# Patient Record
Sex: Female | Born: 1995 | Race: Black or African American | Hispanic: No | Smoking: Never smoker
Health system: Southern US, Community
[De-identification: ages and names within clinical notes are randomized; demographics above are authoritative.]

## PROBLEM LIST (undated history)

## (undated) DIAGNOSIS — Z789 Other specified health status: Secondary | ICD-10-CM

## (undated) HISTORY — PX: WISDOM TOOTH EXTRACTION: SHX21

## (undated) HISTORY — PX: OTHER SURGICAL HISTORY: SHX169

## (undated) HISTORY — DX: Other specified health status: Z78.9

---
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
## 2015-09-18 ENCOUNTER — Encounter: Payer: Self-pay | Admitting: Advanced Practice Midwife

## 2015-09-18 ENCOUNTER — Other Ambulatory Visit: Payer: Self-pay | Admitting: Advanced Practice Midwife

## 2015-11-25 ENCOUNTER — Ambulatory Visit (INDEPENDENT_AMBULATORY_CARE_PROVIDER_SITE_OTHER): Payer: BLUE CROSS/BLUE SHIELD | Admitting: Adult Health

## 2015-11-25 ENCOUNTER — Encounter: Payer: Self-pay | Admitting: Adult Health

## 2015-11-25 VITALS — BP 90/60 | HR 82 | Ht 63.0 in | Wt 113.5 lb

## 2015-11-25 DIAGNOSIS — O219 Vomiting of pregnancy, unspecified: Secondary | ICD-10-CM

## 2015-11-25 DIAGNOSIS — R112 Nausea with vomiting, unspecified: Secondary | ICD-10-CM | POA: Diagnosis not present

## 2015-11-25 DIAGNOSIS — R252 Cramp and spasm: Secondary | ICD-10-CM | POA: Diagnosis not present

## 2015-11-25 DIAGNOSIS — O3680X0 Pregnancy with inconclusive fetal viability, not applicable or unspecified: Secondary | ICD-10-CM

## 2015-11-25 DIAGNOSIS — N926 Irregular menstruation, unspecified: Secondary | ICD-10-CM

## 2015-11-25 DIAGNOSIS — Z3201 Encounter for pregnancy test, result positive: Secondary | ICD-10-CM

## 2015-11-25 DIAGNOSIS — Z3490 Encounter for supervision of normal pregnancy, unspecified, unspecified trimester: Secondary | ICD-10-CM

## 2015-11-25 LAB — POCT URINE PREGNANCY: PREG TEST UR: POSITIVE — AB

## 2015-11-25 MED ORDER — PRENATAL PLUS 27-1 MG PO TABS: 1.0000 | ORAL_TABLET | Freq: Every day | ORAL | 11 refills | Status: DC

## 2015-11-25 MED ORDER — PROMETHAZINE HCL 25 MG PO TABS: 25.0000 mg | ORAL_TABLET | Freq: Four times a day (QID) | ORAL | 1 refills | Status: DC | PRN
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
## 2015-11-25 NOTE — Patient Instructions (Signed)
First Trimester of Pregnancy The first trimester of pregnancy is from week 1 until the end of week 12 (months 1 through 3). A week after a sperm fertilizes an egg, the egg will implant on the wall of the uterus. This embryo will begin to develop into a baby. Genes from you and your partner are forming the baby. The female genes determine whether the baby is a boy or a girl. At 6-8 weeks, the eyes and face are formed, and the heartbeat can be seen on ultrasound. At the end of 12 weeks, all the baby's organs are formed.  Now that you are pregnant, you will want to do everything you can to have a healthy baby. Two of the most important things are to get good prenatal care and to follow your health care provider's instructions. Prenatal care is all the medical care you receive before the baby's birth. This care will help prevent, find, and treat any problems during the pregnancy and childbirth. BODY CHANGES Your body goes through many changes during pregnancy. The changes vary from woman to woman.   You may gain or lose a couple of pounds at first.  You may feel sick to your stomach (nauseous) and throw up (vomit). If the vomiting is uncontrollable, call your health care provider.  You may tire easily.  You may develop headaches that can be relieved by medicines approved by your health care provider.  You may urinate more often. Painful urination may mean you have a bladder infection.  You may develop heartburn as a result of your pregnancy.  You may develop constipation because certain hormones are causing the muscles that push waste through your intestines to slow down.  You may develop hemorrhoids or swollen, bulging veins (varicose veins).  Your breasts may begin to grow larger and become tender. Your nipples may stick out more, and the tissue that surrounds them (areola) may become darker.  Your gums may bleed and may be sensitive to brushing and flossing.  Dark spots or blotches (chloasma,  mask of pregnancy) may develop on your face. This will likely fade after the baby is born.  Your menstrual periods will stop.  You may have a loss of appetite.  You may develop cravings for certain kinds of food.  You may have changes in your emotions from day to day, such as being excited to be pregnant or being concerned that something may go wrong with the pregnancy and baby.  You may have more vivid and strange dreams.  You may have changes in your hair. These can include thickening of your hair, rapid growth, and changes in texture. Some women also have hair loss during or after pregnancy, or hair that feels dry or thin. Your hair will most likely return to normal after your baby is born. WHAT TO EXPECT AT YOUR PRENATAL VISITS During a routine prenatal visit:  You will be weighed to make sure you and the baby are growing normally.  Your blood pressure will be taken.  Your abdomen will be measured to track your baby's growth.  The fetal heartbeat will be listened to starting around week 10 or 12 of your pregnancy.  Test results from any previous visits will be discussed. Your health care provider may ask you:  How you are feeling.  If you are feeling the baby move.  If you have had any abnormal symptoms, such as leaking fluid, bleeding, severe headaches, or abdominal cramping.  If you are using any tobacco products,   including cigarettes, chewing tobacco, and electronic cigarettes.  If you have any questions. Other tests that may be performed during your first trimester include:  Blood tests to find your blood type and to check for the presence of any previous infections. They will also be used to check for low iron levels (anemia) and Rh antibodies. Later in the pregnancy, blood tests for diabetes will be done along with other tests if problems develop.  Urine tests to check for infections, diabetes, or protein in the urine.  An ultrasound to confirm the proper growth  and development of the baby.  An amniocentesis to check for possible genetic problems.  Fetal screens for spina bifida and Down syndrome.  You may need other tests to make sure you and the baby are doing well.  HIV (human immunodeficiency virus) testing. Routine prenatal testing includes screening for HIV, unless you choose not to have this test. HOME CARE INSTRUCTIONS  Medicines  Follow your health care provider's instructions regarding medicine use. Specific medicines may be either safe or unsafe to take during pregnancy.  Take your prenatal vitamins as directed.  If you develop constipation, try taking a stool softener if your health care provider approves. Diet  Eat regular, well-balanced meals. Choose a variety of foods, such as meat or vegetable-based protein, fish, milk and low-fat dairy products, vegetables, fruits, and whole grain breads and cereals. Your health care provider will help you determine the amount of weight gain that is right for you.  Avoid raw meat and uncooked cheese. These carry germs that can cause birth defects in the baby.  Eating four or five small meals rather than three large meals a day may help relieve nausea and vomiting. If you start to feel nauseous, eating a few soda crackers can be helpful. Drinking liquids between meals instead of during meals also seems to help nausea and vomiting.  If you develop constipation, eat more high-fiber foods, such as fresh vegetables or fruit and whole grains. Drink enough fluids to keep your urine clear or pale yellow. Activity and Exercise  Exercise only as directed by your health care provider. Exercising will help you:  Control your weight.  Stay in shape.  Be prepared for labor and delivery.  Experiencing pain or cramping in the lower abdomen or low back is a good sign that you should stop exercising. Check with your health care provider before continuing normal exercises.  Try to avoid standing for long  periods of time. Move your legs often if you must stand in one place for a long time.  Avoid heavy lifting.  Wear low-heeled shoes, and practice good posture.  You may continue to have sex unless your health care provider directs you otherwise. Relief of Pain or Discomfort  Wear a good support bra for breast tenderness.   Take warm sitz baths to soothe any pain or discomfort caused by hemorrhoids. Use hemorrhoid cream if your health care provider approves.   Rest with your legs elevated if you have leg cramps or low back pain.  If you develop varicose veins in your legs, wear support hose. Elevate your feet for 15 minutes, 3-4 times a day. Limit salt in your diet. Prenatal Care  Schedule your prenatal visits by the twelfth week of pregnancy. They are usually scheduled monthly at first, then more often in the last 2 months before delivery.  Write down your questions. Take them to your prenatal visits.  Keep all your prenatal visits as directed by your   health care provider. Safety  Wear your seat belt at all times when driving.  Make a list of emergency phone numbers, including numbers for family, friends, the hospital, and police and fire departments. General Tips  Ask your health care provider for a referral to a local prenatal education class. Begin classes no later than at the beginning of month 6 of your pregnancy.  Ask for help if you have counseling or nutritional needs during pregnancy. Your health care provider can offer advice or refer you to specialists for help with various needs.  Do not use hot tubs, steam rooms, or saunas.  Do not douche or use tampons or scented sanitary pads.  Do not cross your legs for long periods of time.  Avoid cat litter boxes and soil used by cats. These carry germs that can cause birth defects in the baby and possibly loss of the fetus by miscarriage or stillbirth.  Avoid all smoking, herbs, alcohol, and medicines not prescribed by  your health care provider. Chemicals in these affect the formation and growth of the baby.  Do not use any tobacco products, including cigarettes, chewing tobacco, and electronic cigarettes. If you need help quitting, ask your health care provider. You may receive counseling support and other resources to help you quit.  Schedule a dentist appointment. At home, brush your teeth with a soft toothbrush and be gentle when you floss. SEEK MEDICAL CARE IF:   You have dizziness.  You have mild pelvic cramps, pelvic pressure, or nagging pain in the abdominal area.  You have persistent nausea, vomiting, or diarrhea.  You have a bad smelling vaginal discharge.  You have pain with urination.  You notice increased swelling in your face, hands, legs, or ankles. SEEK IMMEDIATE MEDICAL CARE IF:   You have a fever.  You are leaking fluid from your vagina.  You have spotting or bleeding from your vagina.  You have severe abdominal cramping or pain.  You have rapid weight gain or loss.  You vomit blood or material that looks like coffee grounds.  You are exposed to [place] measles and have never had them.  You are exposed to fifth disease or chickenpox.  You develop a severe headache.  You have shortness of breath.  You have any kind of trauma, such as from a fall or a car accident.   This information is not intended to replace advice given to you by your health care provider. Make sure you discuss any questions you have with your health care provider.   Document Released: 01/06/2001 Document Revised: 02/02/2014 Document Reviewed: 11/22/2012 Elsevier Interactive Patient Education [employer]2016 Elsevier Inc. Push fluids Return in 1 week for [place]

## 2015-12-02 ENCOUNTER — Other Ambulatory Visit: Payer: BLUE CROSS/BLUE SHIELD

## 2015-12-05 ENCOUNTER — Ambulatory Visit (INDEPENDENT_AMBULATORY_CARE_PROVIDER_SITE_OTHER): Payer: BLUE CROSS/BLUE SHIELD

## 2015-12-05 DIAGNOSIS — O3680X0 Pregnancy with inconclusive fetal viability, not applicable or unspecified: Secondary | ICD-10-CM

## 2015-12-05 DIAGNOSIS — Z3A08 8 weeks gestation of pregnancy: Secondary | ICD-10-CM

## 2015-12-05 NOTE — Progress Notes (Signed)
US 7+6 wks,single IUP w/ys,pos fht 155 bpm,normal ov's bilat,small amount of simple cul de sac fluid,crl 14.1 mm

## 2015-12-24 ENCOUNTER — Other Ambulatory Visit (HOSPITAL_COMMUNITY)
Admission: RE | Admit: 2015-12-24 | Discharge: 2015-12-24 | Disposition: A | Discharge status: Home / Self Care | Payer: BLUE CROSS/BLUE SHIELD | Source: Ambulatory Visit | Attending: Obstetrics & Gynecology | Admitting: Obstetrics & Gynecology

## 2015-12-24 ENCOUNTER — Encounter: Payer: Self-pay | Admitting: Women's Health

## 2015-12-24 ENCOUNTER — Ambulatory Visit (INDEPENDENT_AMBULATORY_CARE_PROVIDER_SITE_OTHER): Payer: Medicaid Other | Admitting: Women's Health

## 2015-12-24 VITALS — BP 82/64 | HR 74 | Wt 110.0 lb

## 2015-12-24 DIAGNOSIS — Z01419 Encounter for gynecological examination (general) (routine) without abnormal findings: Secondary | ICD-10-CM | POA: Insufficient documentation

## 2015-12-24 DIAGNOSIS — O210 Mild hyperemesis gravidarum: Secondary | ICD-10-CM

## 2015-12-24 DIAGNOSIS — Z124 Encounter for screening for malignant neoplasm of cervix: Secondary | ICD-10-CM

## 2015-12-24 DIAGNOSIS — Z3401 Encounter for supervision of normal first pregnancy, first trimester: Secondary | ICD-10-CM

## 2015-12-24 DIAGNOSIS — Z3A11 11 weeks gestation of pregnancy: Secondary | ICD-10-CM

## 2015-12-24 DIAGNOSIS — Z1389 Encounter for screening for other disorder: Secondary | ICD-10-CM

## 2015-12-24 DIAGNOSIS — Z331 Pregnant state, incidental: Secondary | ICD-10-CM

## 2015-12-24 DIAGNOSIS — Z3400 Encounter for supervision of normal first pregnancy, unspecified trimester: Secondary | ICD-10-CM | POA: Insufficient documentation

## 2015-12-24 DIAGNOSIS — Z113 Encounter for screening for infections with a predominantly sexual mode of transmission: Secondary | ICD-10-CM | POA: Insufficient documentation

## 2015-12-24 DIAGNOSIS — Z3682 Encounter for antenatal screening for nuchal translucency: Secondary | ICD-10-CM

## 2015-12-24 LAB — POCT URINALYSIS DIPSTICK
GLUCOSE UA: NEGATIVE
NITRITE UA: NEGATIVE
Protein, UA: NEGATIVE

## 2015-12-24 MED ORDER — DOXYLAMINE-PYRIDOXINE 10-10 MG PO TBEC: DELAYED_RELEASE_TABLET | ORAL | 6 refills | Status: DC

## 2015-12-24 NOTE — Progress Notes (Signed)
  Subjective:  Maria Henderson is a 20 y.o. G1P0 African American female at 9432w4d by LMP c/w 7wk u/s, being seen today for her first obstetrical visit.  Her obstetrical history is significant for primigravida.  Pregnancy history fully reviewed.  Patient reports n/v, vomits at least once/day- stopped phenergan b/c it was making it worse. Denies vb, cramping, uti s/s, abnormal/malodorous vag d/c, or vulvovaginal itching/irritation.  BP (!) 82/64   Pulse 74   Wt 110 lb (49.9 kg)   LMP 10/14/2015 (Approximate)   BMI 19.49 kg/m   HISTORY: OB History  Gravida Para Term Preterm AB Living  1            SAB TAB Ectopic Multiple Live Births               # Outcome Date GA Lbr Len/2nd Weight Sex Delivery Anes PTL Lv  1 Current              Past Medical History:  Diagnosis Date  . Medical history non-contributory    Past Surgical History:  Procedure Laterality Date  . arm surgery Right    broke arm at 20 years old  . WISDOM TOOTH EXTRACTION     Family History  Problem Relation Age of Onset  . Diabetes Paternal Grandmother   . Breast cancer Mother     Exam   System:     General: Well developed & nourished, no acute distress   Skin: Warm & dry, normal coloration and turgor, no rashes   Neurologic: Alert & oriented, normal mood   Cardiovascular: Regular rate & rhythm   Respiratory: Effort & rate normal, LCTAB, acyanotic   Abdomen: Soft, non tender   Extremities: normal strength, tone   Pelvic Exam:    Perineum: Normal perineum   Vulva: Normal, no lesions   Vagina:  Normal mucosa, normal discharge   Cervix: Normal, bulbous, appears closed   Uterus: Normal size/shape/contour for GA   Thin prep pap smear obtained today w/ reflex high risk HPV cotesting FHR: 152 via doppler   Assessment:   Pregnancy: G1P0 There are no active problems to display for this patient.   4632w4d G1P0 New OB visit N/V of pregnancy  Plan:  Initial labs drawn Continue prenatal  vitamins Problem list reviewed and updated Reviewed n/v relief measures and warning s/s to report Rx [facility]legis, [facility] Reviewed recommended weight gain based on pre-gravid BMI Encouraged well-balanced diet Genetic Screening discussed Integrated Screen: requested Cystic fibrosis screening discussed declined Ultrasound discussed; fetal survey: requested Follow up in 2 weeks for 1st IT/NT and visit [facility] completed  [name], [name] CNM, [facility] 12/24/2015 3:42 PM

## 2015-12-25 LAB — URINALYSIS, ROUTINE W REFLEX MICROSCOPIC
BILIRUBIN UA: NEGATIVE
GLUCOSE, UA: NEGATIVE
Nitrite, UA: NEGATIVE
PROTEIN UA: NEGATIVE
SPEC GRAV UA: 1.012 (ref 1.005–1.030)
Urobilinogen, Ur: 0.2 mg/dL (ref 0.2–1.0)
pH, UA: 5.5 (ref 5.0–7.5)

## 2015-12-25 LAB — RUBELLA SCREEN: Rubella Antibodies, IGG: 13 index (ref >0.99)

## 2015-12-25 LAB — CBC
Hematocrit: 38.5 % (ref 34.0–46.6)
Hemoglobin: 13.2 g/dL (ref 11.1–15.9)
MCH: 30.3 pg (ref 26.6–33.0)
MCHC: 34.3 g/dL (ref 31.5–35.7)
MCV: 88 fL (ref 79–97)
PLATELETS: 334 10*3/uL (ref 150–379)
RBC: 4.36 x10E6/uL (ref 3.77–5.28)
RDW: 13.9 % (ref 12.3–15.4)
WBC: 7.6 10*3/uL (ref 3.4–10.8)

## 2015-12-25 LAB — MICROSCOPIC EXAMINATION: Casts: NONE SEEN /lpf

## 2015-12-25 LAB — HIV ANTIBODY (ROUTINE TESTING W REFLEX): HIV Screen 4th Generation wRfx: NONREACTIVE

## 2015-12-25 LAB — PMP SCREEN PROFILE (10S), URINE
Amphetamine Screen, Ur: NEGATIVE ng/mL
BENZODIAZEPINE SCREEN, URINE: NEGATIVE ng/mL
Barbiturate Screen, Ur: NEGATIVE ng/mL
CREATININE(CRT), U: 64.8 mg/dL (ref 20.0–300.0)
Cannabinoids Ur Ql Scn: NEGATIVE ng/mL
Cocaine(Metab.)Screen, Urine: NEGATIVE ng/mL
METHADONE SCREEN, URINE: NEGATIVE ng/mL
OXYCODONE+OXYMORPHONE UR QL SCN: NEGATIVE ng/mL
Opiate Scrn, Ur: NEGATIVE ng/mL
PCP SCRN UR: NEGATIVE ng/mL
PH UR, DRUG SCRN: 5.1 (ref 4.5–8.9)
PROPOXYPHENE SCREEN: NEGATIVE ng/mL

## 2015-12-25 LAB — SICKLE CELL SCREEN: SICKLE CELL SCREEN: POSITIVE — AB

## 2015-12-25 LAB — HEPATITIS B SURFACE ANTIGEN: HEP B S AG: NEGATIVE

## 2015-12-25 LAB — ANTIBODY SCREEN: Antibody Screen: NEGATIVE

## 2015-12-25 LAB — VARICELLA ZOSTER ANTIBODY, IGG: Varicella zoster IgG: 2187 index (ref >165)

## 2015-12-25 LAB — RPR: RPR Ser Ql: NONREACTIVE

## 2015-12-25 LAB — ABO/RH: Rh Factor: POSITIVE

## 2015-12-26 ENCOUNTER — Encounter: Payer: Self-pay | Admitting: Women's Health

## 2015-12-26 DIAGNOSIS — D573 Sickle-cell trait: Secondary | ICD-10-CM | POA: Insufficient documentation

## 2015-12-26 LAB — CYTOLOGY - PAP
CHLAMYDIA, DNA PROBE: NEGATIVE
DIAGNOSIS: NEGATIVE
NEISSERIA GONORRHEA: NEGATIVE

## 2015-12-26 LAB — URINE CULTURE: Organism ID, Bacteria: NO GROWTH

## 2015-12-26 LAB — SPECIMEN STATUS REPORT

## 2015-12-27 LAB — HEMOGLOBINOPATHY EVALUATION
HEMOGLOBIN A2 QUANTITATION: 4 % — AB (ref 0.7–3.1)
HGB A: 54.2 % — AB (ref 94.0–98.0)
HGB C: 0 %
HGB S: 41.8 % — AB
Hemoglobin F Quantitation: 0 % (ref 0.0–2.0)

## 2015-12-27 LAB — SPECIMEN STATUS REPORT

## 2016-01-07 ENCOUNTER — Encounter: Payer: Self-pay | Admitting: Obstetrics & Gynecology

## 2016-01-07 ENCOUNTER — Ambulatory Visit (INDEPENDENT_AMBULATORY_CARE_PROVIDER_SITE_OTHER): Payer: BLUE CROSS/BLUE SHIELD

## 2016-01-07 ENCOUNTER — Ambulatory Visit (INDEPENDENT_AMBULATORY_CARE_PROVIDER_SITE_OTHER): Payer: BLUE CROSS/BLUE SHIELD | Admitting: Obstetrics & Gynecology

## 2016-01-07 ENCOUNTER — Other Ambulatory Visit: Payer: BLUE CROSS/BLUE SHIELD

## 2016-01-07 VITALS — BP 120/80 | HR 84 | Wt 110.0 lb

## 2016-01-07 DIAGNOSIS — Z331 Pregnant state, incidental: Secondary | ICD-10-CM

## 2016-01-07 DIAGNOSIS — Z1389 Encounter for screening for other disorder: Secondary | ICD-10-CM

## 2016-01-07 DIAGNOSIS — Z3A13 13 weeks gestation of pregnancy: Secondary | ICD-10-CM

## 2016-01-07 DIAGNOSIS — Z3682 Encounter for antenatal screening for nuchal translucency: Secondary | ICD-10-CM | POA: Diagnosis not present

## 2016-01-07 DIAGNOSIS — Z3401 Encounter for supervision of normal first pregnancy, first trimester: Secondary | ICD-10-CM

## 2016-01-07 LAB — POCT URINALYSIS DIPSTICK
GLUCOSE UA: NEGATIVE
Ketones, UA: NEGATIVE
Leukocytes, UA: NEGATIVE
Nitrite, UA: NEGATIVE
Protein, UA: NEGATIVE
RBC UA: NEGATIVE

## 2016-01-07 NOTE — Progress Notes (Signed)
US 12+4 wks,measurements c/w dates,pos fht 155 bpm,normal ov's bilat,NB present,NT 1.4 mm,post pl gr 0

## 2016-01-07 NOTE — Progress Notes (Signed)
G1P0 534w4d Estimated Date of Delivery: 07/17/16  Blood pressure 120/80, pulse 84, weight 110 lb (49.9 kg), last menstrual period 10/11/2015.   BP weight and urine results all reviewed and noted.  Please refer to the obstetrical flow sheet for the fundal height and fetal heart rate documentation:  Patient reports good fetal movement, denies any bleeding and no rupture of membranes symptoms or regular contractions. Patient is without complaints. All questions were answered.  Orders Placed This Encounter  Procedures  . Maternal Screen, Integrated #1  . POCT urinalysis dipstick    Plan:  Continued routine obstetrical care, NT normal  1st IT today, 2nd IT in 4 weeks

## 2016-01-10 LAB — MATERNAL SCREEN, INTEGRATED #1
CROWN RUMP LENGTH MAT SCREEN: 62.1 mm
GEST. AGE ON COLLECTION DATE: 12.6 wk
Maternal Age at EDD: 22.4 years
NUMBER OF FETUSES: 1
Nuchal Translucency (NT): 1.4 mm
PAPP-A VALUE: 1188.3 ng/mL
WEIGHT: 110 [lb_av]

## 2016-01-27 NOTE — L&D Delivery Note (Signed)
Delivery Note At 10:28 AM a viable female was delivered via Vaginal, Spontaneous Delivery (Presentation: OA).  APGAR: 8, 9; weight 7 lb 2.6 oz (3250 g).   Placenta status: delivered intact via Tomasa BlaseSchultz. Cord: with the following complications: none. Cord pH: n/a  Anesthesia: epidural   Episiotomy: None Lacerations: 1st degree perineal; bilateral Labial Suture Repair: 3.0 vicryl rapide Est. Blood Loss (mL): 100  Mom to postpartum.  Baby to Couplet care / Skin to Skin.  Donette LarryMelanie Ledonna Dormer, CNM 06/27/2016, 11:42 AM

## 2016-02-04 ENCOUNTER — Ambulatory Visit (INDEPENDENT_AMBULATORY_CARE_PROVIDER_SITE_OTHER): Payer: BLUE CROSS/BLUE SHIELD | Admitting: Women's Health

## 2016-02-04 ENCOUNTER — Encounter: Payer: Self-pay | Admitting: Women's Health

## 2016-02-04 VITALS — BP 119/64 | HR 105 | Wt 111.0 lb

## 2016-02-04 DIAGNOSIS — Z3682 Encounter for antenatal screening for nuchal translucency: Secondary | ICD-10-CM

## 2016-02-04 DIAGNOSIS — D573 Sickle-cell trait: Secondary | ICD-10-CM

## 2016-02-04 DIAGNOSIS — Z363 Encounter for antenatal screening for malformations: Secondary | ICD-10-CM

## 2016-02-04 DIAGNOSIS — Z3A17 17 weeks gestation of pregnancy: Secondary | ICD-10-CM

## 2016-02-04 DIAGNOSIS — Z331 Pregnant state, incidental: Secondary | ICD-10-CM

## 2016-02-04 DIAGNOSIS — Z1389 Encounter for screening for other disorder: Secondary | ICD-10-CM

## 2016-02-04 DIAGNOSIS — Z3402 Encounter for supervision of normal first pregnancy, second trimester: Secondary | ICD-10-CM

## 2016-02-04 LAB — POCT URINALYSIS DIPSTICK
Glucose, UA: NEGATIVE
Ketones, UA: NEGATIVE
NITRITE UA: NEGATIVE
Protein, UA: NEGATIVE
RBC UA: NEGATIVE

## 2016-02-04 NOTE — Patient Instructions (Signed)
Second Trimester of Pregnancy The second trimester is from week 13 through week 28 (months 4 through 6). The second trimester is often a time when you feel your best. Your body has also adjusted to being pregnant, and you begin to feel better physically. Usually, morning sickness has lessened or quit completely, you may have more energy, and you may have an increase in appetite. The second trimester is also a time when the fetus is growing rapidly. At the end of the sixth month, the fetus is about 9 inches long and weighs about 1 pounds. You will likely begin to feel the baby move (quickening) between 18 and 20 weeks of the pregnancy. Body changes during your second trimester Your body continues to go through many changes during your second trimester. The changes vary from woman to woman.  Your weight will continue to increase. You will notice your lower abdomen bulging out.  You may begin to get stretch marks on your hips, abdomen, and breasts.  You may develop headaches that can be relieved by medicines. The medicines should be approved by your health care provider.  You may urinate more often because the fetus is pressing on your bladder.  You may develop or continue to have heartburn as a result of your pregnancy.  You may develop constipation because certain hormones are causing the muscles that push waste through your intestines to slow down.  You may develop hemorrhoids or swollen, bulging veins (varicose veins).  You may have back pain. This is caused by:  Weight gain.  Pregnancy hormones that are relaxing the joints in your pelvis.  A shift in weight and the muscles that support your balance.  Your breasts will continue to grow and they will continue to become tender.  Your gums may bleed and may be sensitive to brushing and flossing.  Dark spots or blotches (chloasma, mask of pregnancy) may develop on your face. This will likely fade after the baby is born.  A dark line  from your belly button to the pubic area (linea nigra) may appear. This will likely fade after the baby is born.  You may have changes in your hair. These can include thickening of your hair, rapid growth, and changes in texture. Some women also have hair loss during or after pregnancy, or hair that feels dry or thin. Your hair will most likely return to normal after your baby is born. What to expect at prenatal visits During a routine prenatal visit:  You will be weighed to make sure you and the fetus are growing normally.  Your blood pressure will be taken.  Your abdomen will be measured to track your baby's growth.  The fetal heartbeat will be listened to.  Any test results from the previous visit will be discussed. Your health care provider may ask you:  How you are feeling.  If you are feeling the baby move.  If you have had any abnormal symptoms, such as leaking fluid, bleeding, severe headaches, or abdominal cramping.  If you are using any tobacco products, including cigarettes, chewing tobacco, and electronic cigarettes.  If you have any questions. Other tests that may be performed during your second trimester include:  Blood tests that check for:  Low iron levels (anemia).  Gestational diabetes (between 24 and 28 weeks).  Rh antibodies. This is to check for a protein on red blood cells (Rh factor).  Urine tests to check for infections, diabetes, or protein in the urine.  An ultrasound to   confirm the proper growth and development of the baby.  An amniocentesis to check for possible genetic problems.  Fetal screens for spina bifida and Down syndrome.  HIV (human immunodeficiency virus) testing. Routine prenatal testing includes screening for HIV, unless you choose not to have this test. Follow these instructions at home: Eating and drinking  Continue to eat regular, healthy meals.  Avoid raw meat, uncooked cheese, cat litter boxes, and soil used by cats. These  carry germs that can cause birth defects in the baby.  Take your prenatal vitamins.  Take 1500-2000 mg of calcium daily starting at the 20th week of pregnancy until you deliver your baby.  If you develop constipation:  Take over-the-counter or prescription medicines.  Drink enough fluid to keep your urine clear or pale yellow.  Eat foods that are high in fiber, such as fresh fruits and vegetables, whole grains, and beans.  Limit foods that are high in fat and processed sugars, such as fried and sweet foods. Activity  Exercise only as directed by your health care provider. Experiencing uterine cramps is a good sign to stop exercising.  Avoid heavy lifting, wear low heel shoes, and practice good posture.  Wear your seat belt at all times when driving.  Rest with your legs elevated if you have leg cramps or low back pain.  Wear a good support bra for breast tenderness.  Do not use hot tubs, steam rooms, or saunas. Lifestyle  Avoid all smoking, herbs, alcohol, and unprescribed drugs. These chemicals affect the formation and growth of the baby.  Do not use any products that contain nicotine or tobacco, such as cigarettes and e-cigarettes. If you need help quitting, ask your health care provider.  A sexual relationship may be continued unless your health care provider directs you otherwise. General instructions  Follow your health care provider's instructions regarding medicine use. There are medicines that are either safe or unsafe to take during pregnancy.  Take warm sitz baths to soothe any pain or discomfort caused by hemorrhoids. Use hemorrhoid cream if your health care provider approves.  If you develop varicose veins, wear support hose. Elevate your feet for 15 minutes, 3-4 times a day. Limit salt in your diet.  Visit your dentist if you have not gone yet during your pregnancy. Use a soft toothbrush to brush your teeth and be gentle when you floss.  Keep all follow-up  prenatal visits as told by your health care provider. This is important. Contact a health care provider if:  You have dizziness.  You have mild pelvic cramps, pelvic pressure, or nagging pain in the abdominal area.  You have persistent nausea, vomiting, or diarrhea.  You have a bad smelling vaginal discharge.  You have pain with urination. Get help right away if:  You have a fever.  You are leaking fluid from your vagina.  You have spotting or bleeding from your vagina.  You have severe abdominal cramping or pain.  You have rapid weight gain or weight loss.  You have shortness of breath with chest pain.  You notice sudden or extreme swelling of your face, hands, ankles, feet, or legs.  You have not felt your baby move in over an hour.  You have severe headaches that do not go away with medicine.  You have vision changes. Summary  The second trimester is from week 13 through week 28 (months 4 through 6). It is also a time when the fetus is growing rapidly.  Your body goes   through many changes during pregnancy. The changes vary from woman to woman.  Avoid all smoking, herbs, alcohol, and unprescribed drugs. These chemicals affect the formation and growth your baby.  Do not use any tobacco products, such as cigarettes, chewing tobacco, and e-cigarettes. If you need help quitting, ask your health care provider.  Contact your health care provider if you have any questions. Keep all prenatal visits as told by your health care provider. This is important. This information is not intended to replace advice given to you by your health care provider. Make sure you discuss any questions you have with your health care provider. Document Released: 01/06/2001 Document Revised: 06/20/2015 Document Reviewed: 03/15/2012 Elsevier Interactive Patient Education  2017 Elsevier Inc.  

## 2016-02-04 NOTE — Progress Notes (Signed)
Low-risk OB appointment G1P0 5452w4d Estimated Date of Delivery: 07/17/16 BP 119/64   Pulse (!) 105   Wt 111 lb (50.3 kg)   LMP 10/11/2015   BMI 19.66 kg/m   BP, weight, and urine reviewed.  Refer to obstetrical flow sheet for FH & FHR.  Feeling some fm. Denies cramping, lof, vb, or uti s/s. No complaints. Reviewed warning s/s to report. Discussed [place] trait +, fob unsure if he's ever been tested- to talk to parents, can do next visit if not.  Plan:  Continue routine obstetrical care  F/U in 3wks for OB appointment and anatomy u/s 2nd IT today

## 2016-02-07 LAB — URINE CULTURE

## 2016-02-08 LAB — MATERNAL SCREEN, INTEGRATED #2
AFP MoM: 0.85
Alpha-Fetoprotein: 37.3 ng/mL
Crown Rump Length: 62.1 mm
DIA MOM: 1.3
DIA VALUE: 277.9 pg/mL
ESTRIOL UNCONJUGATED: 0.67 ng/mL
GESTATIONAL AGE: 16.6 wk
Gest. Age on Collection Date: 12.6 weeks
HCG MOM: 2.44
Maternal Age at EDD: 22.4 years
NUCHAL TRANSLUCENCY (NT): 1.4 mm
NUCHAL TRANSLUCENCY MOM: 0.89
Number of Fetuses: 1
PAPP-A MOM: 0.85
PAPP-A Value: 1188.3 ng/mL
Test Results:: NEGATIVE
Weight: 110 [lb_av]
Weight: 110 [lb_av]
hCG Value: 87 IU/mL
uE3 MoM: 0.68

## 2016-02-25 ENCOUNTER — Encounter: Payer: Self-pay | Admitting: Advanced Practice Midwife

## 2016-02-25 ENCOUNTER — Ambulatory Visit (INDEPENDENT_AMBULATORY_CARE_PROVIDER_SITE_OTHER): Payer: Medicaid Other | Admitting: Advanced Practice Midwife

## 2016-02-25 ENCOUNTER — Ambulatory Visit (INDEPENDENT_AMBULATORY_CARE_PROVIDER_SITE_OTHER): Payer: Medicaid Other

## 2016-02-25 VITALS — BP 98/50 | HR 76 | Wt 113.0 lb

## 2016-02-25 DIAGNOSIS — Z363 Encounter for antenatal screening for malformations: Secondary | ICD-10-CM | POA: Diagnosis not present

## 2016-02-25 DIAGNOSIS — O321XX1 Maternal care for breech presentation, fetus 1: Secondary | ICD-10-CM | POA: Diagnosis not present

## 2016-02-25 DIAGNOSIS — O3492 Maternal care for abnormality of pelvic organ, unspecified, second trimester: Secondary | ICD-10-CM

## 2016-02-25 DIAGNOSIS — Z3A20 20 weeks gestation of pregnancy: Secondary | ICD-10-CM

## 2016-02-25 DIAGNOSIS — Z3402 Encounter for supervision of normal first pregnancy, second trimester: Secondary | ICD-10-CM

## 2016-02-25 DIAGNOSIS — Z1389 Encounter for screening for other disorder: Secondary | ICD-10-CM

## 2016-02-25 DIAGNOSIS — Z331 Pregnant state, incidental: Secondary | ICD-10-CM

## 2016-02-25 LAB — POCT URINALYSIS DIPSTICK
Blood, UA: NEGATIVE
GLUCOSE UA: NEGATIVE
Ketones, UA: NEGATIVE
LEUKOCYTES UA: NEGATIVE
Protein, UA: NEGATIVE

## 2016-02-25 NOTE — Progress Notes (Signed)
US 19+4 wks,breech,cx 3.1 cm,normal right ovary,left ovary not visualized,post pl gr 0,bilat choroid plexus cyst, right (#1) 2.7 x 4.6 x 2.7 mm, (#2)4.5 x 2.5 x 3.9 mm,left 3.1 x 3 x 4 mm,svp of fluid 4.2 cm,efw 316 g,anatomy complete

## 2016-02-25 NOTE — Progress Notes (Signed)
G1P0 5443w4d Estimated Date of Delivery: 07/17/16  Last menstrual period 10/11/2015.   BP weight and urine results all reviewed and noted.  Please refer to the obstetrical flow sheet for the fundal height and fetal heart rate documentation:  US 19+4 wks,breech,cx 3.1 cm,normal right ovary,left ovary not visualized,post pl gr 0,bilat choroid plexus cyst, right (#1) 2.7 x 4.6 x 2.7 mm, (#2)4.5 x 2.5 x 3.9 mm,left 3.1 x 3 x 4 mm,svp of fluid 4.2 cm,efw 316 g,anatomy complete  Patient reports good fetal movement, denies any bleeding and no rupture of membranes symptoms or regular contractions. Patient is without complaints. All questions were answered.  Orders Placed This Encounter  Procedures  . POCT urinalysis dipstick    Plan:  Continued routine obstetrical care,   Return in about 4 weeks (around 03/24/2016) for LROB.

## 2016-02-25 NOTE — Patient Instructions (Signed)
Second Trimester of Pregnancy The second trimester is from week 13 through week 28, month 4 through 6. This is often the time in pregnancy that you feel your best. Often times, morning sickness has lessened or quit. You may have more energy, and you may get hungry more often. Your unborn baby (fetus) is growing rapidly. At the end of the sixth month, he or she is about 9 inches long and weighs about 1 pounds. You will likely feel the baby move (quickening) between 18 and 20 weeks of pregnancy. Follow these instructions at home:  Avoid all smoking, herbs, and alcohol. Avoid drugs not approved by your doctor.  Do not use any tobacco products, including cigarettes, chewing tobacco, and electronic cigarettes. If you need help quitting, ask your doctor. You may get counseling or other support to help you quit.  Only take medicine as told by your doctor. Some medicines are safe and some are not during pregnancy.  Exercise only as told by your doctor. Stop exercising if you start having cramps.  Eat regular, healthy meals.  Wear a good support bra if your breasts are tender.  Do not use hot tubs, steam rooms, or saunas.  Wear your seat belt when driving.  Avoid raw meat, uncooked cheese, and liter boxes and soil used by cats.  Take your prenatal vitamins.  Take 1500-2000 milligrams of calcium daily starting at the 20th week of pregnancy until you deliver your baby.  Try taking medicine that helps you poop (stool softener) as needed, and if your doctor approves. Eat more fiber by eating fresh fruit, vegetables, and whole grains. Drink enough fluids to keep your pee (urine) clear or pale yellow.  Take warm water baths (sitz baths) to soothe pain or discomfort caused by hemorrhoids. Use hemorrhoid cream if your doctor approves.  If you have puffy, bulging veins (varicose veins), wear support hose. Raise (elevate) your feet for 15 minutes, 3-4 times a day. Limit salt in your diet.  Avoid heavy  lifting, wear low heals, and sit up straight.  Rest with your legs raised if you have leg cramps or low back pain.  Visit your dentist if you have not gone during your pregnancy. Use a soft toothbrush to brush your teeth. Be gentle when you floss.  You can have sex (intercourse) unless your doctor tells you not to.  Go to your doctor visits. Get help if:  You feel dizzy.  You have mild cramps or pressure in your lower belly (abdomen).  You have a nagging pain in your belly area.  You continue to feel sick to your stomach (nauseous), throw up (vomit), or have watery poop (diarrhea).  You have bad smelling fluid coming from your vagina.  You have pain with peeing (urination). Get help right away if:  You have a fever.  You are leaking fluid from your vagina.  You have spotting or bleeding from your vagina.  You have severe belly cramping or pain.  You lose or gain weight rapidly.  You have trouble catching your breath and have chest pain.  You notice sudden or extreme puffiness (swelling) of your face, hands, ankles, feet, or legs.  You have not felt the baby move in over an hour.  You have severe headaches that do not go away with medicine.  You have vision changes. This information is not intended to replace advice given to you by your health care provider. Make sure you discuss any questions you have with your health care   provider. Document Released: 04/08/2009 Document Revised: 06/20/2015 Document Reviewed: 03/15/2012 Elsevier Interactive Patient Education  2017 [employer]  

## 2016-03-18 ENCOUNTER — Encounter: Payer: Self-pay | Admitting: Obstetrics & Gynecology

## 2016-03-24 ENCOUNTER — Ambulatory Visit (INDEPENDENT_AMBULATORY_CARE_PROVIDER_SITE_OTHER): Payer: Medicaid Other | Admitting: Advanced Practice Midwife

## 2016-03-24 ENCOUNTER — Encounter: Payer: Self-pay | Admitting: Advanced Practice Midwife

## 2016-03-24 VITALS — BP 90/60 | HR 85 | Wt 119.0 lb

## 2016-03-24 DIAGNOSIS — Z3A24 24 weeks gestation of pregnancy: Secondary | ICD-10-CM

## 2016-03-24 DIAGNOSIS — O3503X0 Maternal care for (suspected) central nervous system malformation or damage in fetus, choroid plexus cysts, not applicable or unspecified: Secondary | ICD-10-CM

## 2016-03-24 DIAGNOSIS — Z331 Pregnant state, incidental: Secondary | ICD-10-CM

## 2016-03-24 DIAGNOSIS — O350XX0 Maternal care for (suspected) central nervous system malformation in fetus, not applicable or unspecified: Secondary | ICD-10-CM

## 2016-03-24 DIAGNOSIS — Z3402 Encounter for supervision of normal first pregnancy, second trimester: Secondary | ICD-10-CM

## 2016-03-24 DIAGNOSIS — Z1389 Encounter for screening for other disorder: Secondary | ICD-10-CM

## 2016-03-24 LAB — POCT URINALYSIS DIPSTICK
Blood, UA: NEGATIVE
Glucose, UA: NEGATIVE
KETONES UA: NEGATIVE
Nitrite, UA: NEGATIVE
PROTEIN UA: NEGATIVE

## 2016-03-24 NOTE — Progress Notes (Signed)
G1P0 150w4d Estimated Date of Delivery: 07/17/16  Blood pressure 90/60, pulse 85, weight 119 lb (54 kg), last menstrual period 10/11/2015.   BP weight and urine results all reviewed and noted.  Please refer to the obstetrical flow sheet for the fundal height and fetal heart rate documentation:  Patient reports good fetal movement, denies any bleeding and no rupture of membranes symptoms or regular contractions. Patient is without complaints. All questions were answered.  Orders Placed This Encounter  Procedures  . US OB Follow Up  . POCT urinalysis dipstick    Plan:  Continued routine obstetrical care,   Return in about 4 weeks (around 04/21/2016) for PN2/LROB, US:EFW.  Recheck CP cysts

## 2016-03-24 NOTE — Patient Instructions (Addendum)
1. Before your test, do not eat or drink anything for 8-10 hours prior to your  appointment (a small amount of water is allowed and you may take any medicines you normally take). Be sure to drink lots of water the day before. 2. When you arrive, your blood will be drawn for a 'fasting' blood sugar level.  Then you will be given a sweetened carbonated beverage to drink. You should  complete drinking this beverage within five minutes. After finishing the  beverage, you will have your blood drawn exactly 1 and 2 hours later. Having  your blood drawn on time is an important part of this test. A total of three blood  samples will be done. 3. The test takes approximately 2  hours. During the test, do not have anything to  eat or drink. Do not smoke, chew gum (not even sugarless gum) or use breath mints.  4. During the test you should remain close by and seated as much as possible and  avoid walking around. You may want to bring a book or something else to  occupy your time.  5. After your test, you may eat and drink as normal. You may want to bring a snack  to eat after the test is finished. Your provider will advise you as to the results of  this test and any follow-up if necessary  If your sugar test is positive for gestational diabetes, you will be given an phone call and further instructions discussed. If you wish to know all of your test results before your next appointment, feel free to call the office, or look up your test results on Mychart.  (The range that the lab uses for normal values of the sugar test are not necessarily the range that is used for pregnant women; if your results are within the normal range, they are definitely normal.  However, if a value is deemed "high" by the lab, it may not be too high for a pregnant woman.  We will need to discuss the results if your value(s) fall in the "high" category).     Tdap Vaccine  It is recommended that you get the Tdap vaccine during the  third trimester of EACH pregnancy to help protect your baby from getting pertussis (whooping cough)  27-36 weeks is the BEST time to do this so that you can pass the protection on to your baby. During pregnancy is better than after pregnancy, but if you are unable to get it during pregnancy it will be offered at the hospital.  You can get this vaccine at the health department or your family doctor, as well as some pharmacies.  Everyone who will be around your baby should also be up-to-date on their vaccines. Adults (who are not pregnant) only need 1 dose of Tdap during adulthood.      [phone] is the phone number for Pregnancy Classes or hospital tours at [facility].   You will be referred to  [email or web address] for more information on childbirth classes  At this site you may register for classes. You may sign up for a waiting list if classes are full. Please SIGN UP FOR THIS!.   When the waiting list becomes long, sometimes new classes can be added.

## 2016-04-21 ENCOUNTER — Encounter: Payer: Self-pay | Admitting: Obstetrics & Gynecology

## 2016-04-21 ENCOUNTER — Ambulatory Visit (INDEPENDENT_AMBULATORY_CARE_PROVIDER_SITE_OTHER): Payer: Medicaid Other | Admitting: Obstetrics & Gynecology

## 2016-04-21 ENCOUNTER — Other Ambulatory Visit: Payer: Medicaid Other

## 2016-04-21 ENCOUNTER — Ambulatory Visit (INDEPENDENT_AMBULATORY_CARE_PROVIDER_SITE_OTHER): Payer: Medicaid Other

## 2016-04-21 VITALS — BP 104/58 | HR 113 | Wt 123.0 lb

## 2016-04-21 DIAGNOSIS — O350XX0 Maternal care for (suspected) central nervous system malformation in fetus, not applicable or unspecified: Secondary | ICD-10-CM | POA: Diagnosis not present

## 2016-04-21 DIAGNOSIS — Z3A27 27 weeks gestation of pregnancy: Secondary | ICD-10-CM

## 2016-04-21 DIAGNOSIS — Z3402 Encounter for supervision of normal first pregnancy, second trimester: Secondary | ICD-10-CM

## 2016-04-21 DIAGNOSIS — Z1389 Encounter for screening for other disorder: Secondary | ICD-10-CM

## 2016-04-21 DIAGNOSIS — Z331 Pregnant state, incidental: Secondary | ICD-10-CM

## 2016-04-21 DIAGNOSIS — O3503X0 Maternal care for (suspected) central nervous system malformation or damage in fetus, choroid plexus cysts, not applicable or unspecified: Secondary | ICD-10-CM

## 2016-04-21 LAB — POCT URINALYSIS DIPSTICK
Glucose, UA: NEGATIVE
KETONES UA: NEGATIVE
Leukocytes, UA: NEGATIVE
Nitrite, UA: NEGATIVE
PROTEIN UA: NEGATIVE
RBC UA: NEGATIVE

## 2016-04-21 NOTE — Progress Notes (Signed)
G1P0 5059w4d Estimated Date of Delivery: 07/17/16  Blood pressure (!) 104/58, pulse (!) 113, weight 123 lb (55.8 kg), last menstrual period 10/11/2015.   BP weight and urine results all reviewed and noted.  Please refer to the obstetrical flow sheet for the fundal height and fetal heart rate documentation:  Patient reports good fetal movement, denies any bleeding and no rupture of membranes symptoms or regular contractions. Patient is without complaints. All questions were answered.  Orders Placed This Encounter  Procedures  . POCT urinalysis dipstick    Plan:  Continued routine obstetrical care, sonogram is normal resolution of the CP cysts, PN2 today  Return in about 3 weeks (around 05/12/2016) for LROB.

## 2016-04-21 NOTE — Progress Notes (Signed)
US 27+4 wks,cephalic,post pl gr 0,resolved choroid plexus cysts,cx 3.1 cm,afi 13.8 cm,fhr 131 bpm,efw 1166 g 59%

## 2016-04-22 LAB — CBC
HEMOGLOBIN: 11.9 g/dL (ref 11.1–15.9)
Hematocrit: 34 % (ref 34.0–46.6)
MCH: 30.7 pg (ref 26.6–33.0)
MCHC: 35 g/dL (ref 31.5–35.7)
MCV: 88 fL (ref 79–97)
PLATELETS: 273 10*3/uL (ref 150–379)
RBC: 3.87 x10E6/uL (ref 3.77–5.28)
RDW: 14.5 % (ref 12.3–15.4)
WBC: 6.7 10*3/uL (ref 3.4–10.8)

## 2016-04-22 LAB — GLUCOSE TOLERANCE, 2 HOURS W/ 1HR
GLUCOSE, 2 HOUR: 116 mg/dL (ref 65–152)
Glucose, 1 hour: 159 mg/dL (ref 65–179)
Glucose, Fasting: 66 mg/dL (ref 65–91)

## 2016-04-22 LAB — RPR: RPR: NONREACTIVE

## 2016-04-22 LAB — HIV ANTIBODY (ROUTINE TESTING W REFLEX): HIV Screen 4th Generation wRfx: NONREACTIVE

## 2016-04-22 LAB — ANTIBODY SCREEN: Antibody Screen: NEGATIVE

## 2016-05-12 ENCOUNTER — Encounter: Payer: Self-pay | Admitting: Women's Health

## 2016-05-12 ENCOUNTER — Ambulatory Visit (INDEPENDENT_AMBULATORY_CARE_PROVIDER_SITE_OTHER): Payer: Medicaid Other | Admitting: Women's Health

## 2016-05-12 VITALS — BP 80/60 | HR 86 | Wt 126.5 lb

## 2016-05-12 DIAGNOSIS — O99013 Anemia complicating pregnancy, third trimester: Secondary | ICD-10-CM

## 2016-05-12 DIAGNOSIS — Z3403 Encounter for supervision of normal first pregnancy, third trimester: Secondary | ICD-10-CM

## 2016-05-12 DIAGNOSIS — D573 Sickle-cell trait: Secondary | ICD-10-CM

## 2016-05-12 DIAGNOSIS — Z1389 Encounter for screening for other disorder: Secondary | ICD-10-CM

## 2016-05-12 DIAGNOSIS — Z331 Pregnant state, incidental: Secondary | ICD-10-CM

## 2016-05-12 LAB — POCT URINALYSIS DIPSTICK
GLUCOSE UA: NEGATIVE
Ketones, UA: NEGATIVE
NITRITE UA: NEGATIVE
Protein, UA: NEGATIVE
RBC UA: NEGATIVE

## 2016-05-12 NOTE — Progress Notes (Signed)
Low-risk OB appointment G1P0 [redacted]w[redacted]d Estimated Date of Delivery: 07/17/16 BP (!) 80/60   Pulse 86   Wt 126 lb 8 oz (57.4 kg)   LMP 10/11/2015   BMI 22.41 kg/m   BP, weight, and urine reviewed.  Refer to obstetrical flow sheet for FH & FHR.  Reports good fm.  Denies regular uc's, lof, vb, or uti s/s. No complaints. Reviewed ptl s/s, pn2 results, fkc. Recommended Tdap at HD/PCP per CDC guidelines.  Plan:  Continue routine obstetrical care  F/U in 2wks for OB appointment  FH s<d, efw 59%/afi 13.8cm @ last visit @ 27wks, will continue to monitor

## 2016-05-12 NOTE — Patient Instructions (Signed)
Call the office [phone]) or go to [facility] if:  You begin to have strong, frequent contractions  Your water breaks.  Sometimes it is a big gush of fluid, sometimes it is just a trickle that keeps getting your panties wet or running down your legs  You have vaginal bleeding.  It is normal to have a small amount of spotting if your cervix was checked.   You don't feel your baby moving like normal.  If you don't, get you something to eat and drink and lay down and focus on feeling your baby move.  You should feel at least 10 movements in 2 hours.  If you don't, you should call the office or go to [facility].    [place] Pediatricians/Family Doctors:  [facility] [phone]            [facility] [phone]                 [facility] [phone] (usually not accepting new patients unless you have family there already, you are always welcome to call and ask)            [place] Pediatricians/Family Doctors:   [facility]: [phone]  [facility]: [phone]   Tdap Vaccine  It is recommended that you get the Tdap vaccine during the third trimester of EACH pregnancy to help protect your baby from getting pertussis (whooping cough)  27-36 weeks is the BEST time to do this so that you can pass the protection on to your baby. During pregnancy is better than after pregnancy, but if you are unable to get it during pregnancy it will be offered at the hospital.   You can get this vaccine at the health department or your family doctor  Everyone who will be around your baby should also be up-to-date on their vaccines. Adults (who are not pregnant) only need 1 dose of Tdap during adulthood.      Third Trimester of Pregnancy The third trimester is from week 28 through week 40 (months 7 through 9). The third trimester is a time when the unborn baby (fetus) is growing rapidly. At the end of the ninth month, the  fetus is about 20 inches in length and weighs 6-10 pounds. Body changes during your third trimester Your body will continue to go through many changes during pregnancy. The changes vary from woman to woman. During the third trimester:  Your weight will continue to increase. You can expect to gain 25-35 pounds (11-16 kg) by the end of the pregnancy.  You may begin to get stretch marks on your hips, abdomen, and breasts.  You may urinate more often because the fetus is moving lower into your pelvis and pressing on your bladder.  You may develop or continue to have heartburn. This is caused by increased hormones that slow down muscles in the digestive tract.  You may develop or continue to have constipation because increased hormones slow digestion and cause the muscles that push waste through your intestines to relax.  You may develop hemorrhoids. These are swollen veins (varicose veins) in the rectum that can itch or be painful.  You may develop swollen, bulging veins (varicose veins) in your legs.  You may have increased body aches in the pelvis, back, or thighs. This is due to weight gain and increased hormones that are relaxing your joints.  You may have changes in your hair. These can include thickening of your hair, rapid growth, and changes  in texture. Some women also have hair loss during or after pregnancy, or hair that feels dry or thin. Your hair will most likely return to normal after your baby is born.  Your breasts will continue to grow and they will continue to become tender. A yellow fluid (colostrum) may leak from your breasts. This is the first milk you are producing for your baby.  Your belly button may stick out.  You may notice more swelling in your hands, face, or ankles.  You may have increased tingling or numbness in your hands, arms, and legs. The skin on your belly may also feel numb.  You may feel short of breath because of your expanding uterus.  You may have  more problems sleeping. This can be caused by the size of your belly, increased need to urinate, and an increase in your body's metabolism.  You may notice the fetus "dropping," or moving lower in your abdomen (lightening).  You may have increased vaginal discharge.  You may notice your joints feel loose and you may have pain around your pelvic bone. What to expect at prenatal visits You will have prenatal exams every 2 weeks until week 36. Then you will have weekly prenatal exams. During a routine prenatal visit:  You will be weighed to make sure you and the baby are growing normally.  Your blood pressure will be taken.  Your abdomen will be measured to track your baby's growth.  The fetal heartbeat will be listened to.  Any test results from the previous visit will be discussed.  You may have a cervical check near your due date to see if your cervix has softened or thinned (effaced).  You will be tested for Group B streptococcus. This happens between 35 and 37 weeks. Your health care provider may ask you:  What your birth plan is.  How you are feeling.  If you are feeling the baby move.  If you have had any abnormal symptoms, such as leaking fluid, bleeding, severe headaches, or abdominal cramping.  If you are using any tobacco products, including cigarettes, chewing tobacco, and electronic cigarettes.  If you have any questions. Other tests or screenings that may be performed during your third trimester include:  Blood tests that check for low iron levels (anemia).  Fetal testing to check the health, activity level, and growth of the fetus. Testing is done if you have certain medical conditions or if there are problems during the pregnancy.  Nonstress test (NST). This test checks the health of your baby to make sure there are no signs of problems, such as the baby not getting enough oxygen. During this test, a belt is placed around your belly. The baby is made to move,  and its heart rate is monitored during movement. What is false labor? False labor is a condition in which you feel small, irregular tightenings of the muscles in the womb (contractions) that usually go away with rest, changing position, or drinking water. These are called Braxton Hicks contractions. Contractions may last for hours, days, or even weeks before true labor sets in. If contractions come at regular intervals, become more frequent, increase in intensity, or become painful, you should see your health care provider. What are the signs of labor?  Abdominal cramps.  Regular contractions that start at 10 minutes apart and become stronger and more frequent with time.  Contractions that start on the top of the uterus and spread down to the lower abdomen and back.  Increased pelvic pressure and dull back pain.  A watery or bloody mucus discharge that comes from the vagina.  Leaking of amniotic fluid. This is also known as your "water breaking." It could be a slow trickle or a gush. Let your health care provider know if it has a color or strange odor. If you have any of these signs, call your health care provider right away, even if it is before your due date. Follow these instructions at home: Medicines   Follow your health care provider's instructions regarding medicine use. Specific medicines may be either safe or unsafe to take during pregnancy.  Take a prenatal vitamin that contains at least 600 micrograms (mcg) of folic acid.  If you develop constipation, try taking a stool softener if your health care provider approves. Eating and drinking   Eat a balanced diet that includes fresh fruits and vegetables, whole grains, good sources of protein such as meat, eggs, or tofu, and low-fat dairy. Your health care provider will help you determine the amount of weight gain that is right for you.  Avoid raw meat and uncooked cheese. These carry germs that can cause birth defects in the  baby.  If you have low calcium intake from food, talk to your health care provider about whether you should take a daily calcium supplement.  Eat four or five small meals rather than three large meals a day.  Limit foods that are high in fat and processed sugars, such as fried and sweet foods.  To prevent constipation:  Drink enough fluid to keep your urine clear or pale yellow.  Eat foods that are high in fiber, such as fresh fruits and vegetables, whole grains, and beans. Activity   Exercise only as directed by your health care provider. Most women can continue their usual exercise routine during pregnancy. Try to exercise for 30 minutes at least 5 days a week. Stop exercising if you experience uterine contractions.  Avoid heavy lifting.  Do not exercise in extreme heat or humidity, or at high altitudes.  Wear low-heel, comfortable shoes.  Practice good posture.  You may continue to have sex unless your health care provider tells you otherwise. Relieving pain and discomfort   Take frequent breaks and rest with your legs elevated if you have leg cramps or low back pain.  Take warm sitz baths to soothe any pain or discomfort caused by hemorrhoids. Use hemorrhoid cream if your health care provider approves.  Wear a good support bra to prevent discomfort from breast tenderness.  If you develop varicose veins:  Wear support pantyhose or compression stockings as told by your healthcare provider.  Elevate your feet for 15 minutes, 3-4 times a day. Prenatal care   Write down your questions. Take them to your prenatal visits.  Keep all your prenatal visits as told by your health care provider. This is important. Safety   Wear your seat belt at all times when driving.  Make a list of emergency phone numbers, including numbers for family, friends, the hospital, and police and fire departments. General instructions   Avoid cat litter boxes and soil used by cats. These carry  germs that can cause birth defects in the baby. If you have a cat, ask someone to clean the litter box for you.  Do not travel far distances unless it is absolutely necessary and only with the approval of your health care provider.  Do not use hot tubs, steam rooms, or saunas.  Do not drink  alcohol.  Do not use any products that contain nicotine or tobacco, such as cigarettes and e-cigarettes. If you need help quitting, ask your health care provider.  Do not use any medicinal herbs or unprescribed drugs. These chemicals affect the formation and growth of the baby.  Do not douche or use tampons or scented sanitary pads.  Do not cross your legs for long periods of time.  To prepare for the arrival of your baby:  Take prenatal classes to understand, practice, and ask questions about labor and delivery.  Make a trial run to the hospital.  Visit the hospital and tour the maternity area.  Arrange for maternity or paternity leave through employers.  Arrange for family and friends to take care of pets while you are in the hospital.  Purchase a rear-facing car seat and make sure you know how to install it in your car.  Pack your hospital bag.  Prepare the baby's nursery. Make sure to remove all pillows and stuffed animals from the baby's crib to prevent suffocation.  Visit your dentist if you have not gone during your pregnancy. Use a soft toothbrush to brush your teeth and be gentle when you floss. Contact a health care provider if:  You are unsure if you are in labor or if your water has broken.  You become dizzy.  You have mild pelvic cramps, pelvic pressure, or nagging pain in your abdominal area.  You have lower back pain.  You have persistent nausea, vomiting, or diarrhea.  You have an unusual or bad smelling vaginal discharge.  You have pain when you urinate. Get help right away if:  Your water breaks before 37 weeks.  You have regular contractions less than 5  minutes apart before 37 weeks.  You have a fever.  You are leaking fluid from your vagina.  You have spotting or bleeding from your vagina.  You have severe abdominal pain or cramping.  You have rapid weight loss or weight gain.  You have shortness of breath with chest pain.  You notice sudden or extreme swelling of your face, hands, ankles, feet, or legs.  Your baby makes fewer than 10 movements in 2 hours.  You have severe headaches that do not go away when you take medicine.  You have vision changes. Summary  The third trimester is from week 28 through week 40, months 7 through 9. The third trimester is a time when the unborn baby (fetus) is growing rapidly.  During the third trimester, your discomfort may increase as you and your baby continue to gain weight. You may have abdominal, leg, and back pain, sleeping problems, and an increased need to urinate.  During the third trimester your breasts will keep growing and they will continue to become tender. A yellow fluid (colostrum) may leak from your breasts. This is the first milk you are producing for your baby.  False labor is a condition in which you feel small, irregular tightenings of the muscles in the womb (contractions) that eventually go away. These are called Braxton Hicks contractions. Contractions may last for hours, days, or even weeks before true labor sets in.  Signs of labor can include: abdominal cramps; regular contractions that start at 10 minutes apart and become stronger and more frequent with time; watery or bloody mucus discharge that comes from the vagina; increased pelvic pressure and dull back pain; and leaking of amniotic fluid. This information is not intended to replace advice given to you by your health  care provider. Make sure you discuss any questions you have with your health care provider. Document Released: 01/06/2001 Document Revised: 06/20/2015 Document Reviewed: 03/15/2012 Elsevier Interactive  Patient Education  2017 [employer].

## 2016-05-14 LAB — URINE CULTURE: Organism ID, Bacteria: NO GROWTH

## 2016-05-26 ENCOUNTER — Ambulatory Visit (INDEPENDENT_AMBULATORY_CARE_PROVIDER_SITE_OTHER): Payer: Medicaid Other

## 2016-05-26 ENCOUNTER — Ambulatory Visit (INDEPENDENT_AMBULATORY_CARE_PROVIDER_SITE_OTHER): Payer: Medicaid Other | Admitting: Advanced Practice Midwife

## 2016-05-26 ENCOUNTER — Encounter: Payer: Self-pay | Admitting: Obstetrics & Gynecology

## 2016-05-26 VITALS — BP 90/50 | HR 88 | Wt 127.0 lb

## 2016-05-26 DIAGNOSIS — O26843 Uterine size-date discrepancy, third trimester: Secondary | ICD-10-CM

## 2016-05-26 DIAGNOSIS — Z331 Pregnant state, incidental: Secondary | ICD-10-CM

## 2016-05-26 DIAGNOSIS — Z3403 Encounter for supervision of normal first pregnancy, third trimester: Secondary | ICD-10-CM

## 2016-05-26 DIAGNOSIS — Z1389 Encounter for screening for other disorder: Secondary | ICD-10-CM

## 2016-05-26 LAB — POCT URINALYSIS DIPSTICK
Blood, UA: NEGATIVE
Glucose, UA: NEGATIVE
KETONES UA: NEGATIVE
Leukocytes, UA: NEGATIVE
Nitrite, UA: NEGATIVE
PROTEIN UA: NEGATIVE

## 2016-05-26 NOTE — Progress Notes (Signed)
G1P0 [redacted]w[redacted]d Estimated Date of Delivery: 07/17/16  Blood pressure (!) 90/50, pulse 88, weight 127 lb (57.6 kg), last menstrual period 10/11/2015.   BP weight and urine results all reviewed and noted.  Please refer to the obstetrical flow sheet for the fundal height and fetal heart rate documentation:  Patient reports good fetal movement, denies any bleeding and no rupture of membranes symptoms or regular contractions. Patient is without complaints. All questions were answered.  Orders Placed This Encounter  Procedures  . POCT urinalysis dipstick   Size<dates. Pt denies N/V, reports good appetite, healthy eating habits. Plan: U/S today for EFW. Continued routine obstetrical care.  Return in about 2 weeks (around 06/09/2016).   Jonetta Speak

## 2016-05-26 NOTE — Progress Notes (Signed)
[place] 32+4 wks,cephalic,post pl gr 1,normal ovaries bilat,fhr 124 bpm,afi 13 cm,EFW 2029 g 48%

## 2016-05-26 NOTE — Patient Instructions (Signed)
Third Trimester of Pregnancy The third trimester is from week 28 through week 40 (months 7 through 9). The third trimester is a time when the unborn baby (fetus) is growing rapidly. At the end of the ninth month, the fetus is about 20 inches in length and weighs 6-10 pounds. Body changes during your third trimester Your body will continue to go through many changes during pregnancy. The changes vary from woman to woman. During the third trimester:  Your weight will continue to increase. You can expect to gain 25-35 pounds (11-16 kg) by the end of the pregnancy.  You may begin to get stretch marks on your hips, abdomen, and breasts.  You may urinate more often because the fetus is moving lower into your pelvis and pressing on your bladder.  You may develop or continue to have heartburn. This is caused by increased hormones that slow down muscles in the digestive tract.  You may develop or continue to have constipation because increased hormones slow digestion and cause the muscles that push waste through your intestines to relax.  You may develop hemorrhoids. These are swollen veins (varicose veins) in the rectum that can itch or be painful.  You may develop swollen, bulging veins (varicose veins) in your legs.  You may have increased body aches in the pelvis, back, or thighs. This is due to weight gain and increased hormones that are relaxing your joints.  You may have changes in your hair. These can include thickening of your hair, rapid growth, and changes in texture. Some women also have hair loss during or after pregnancy, or hair that feels dry or thin. Your hair will most likely return to normal after your baby is born.  Your breasts will continue to grow and they will continue to become tender. A yellow fluid (colostrum) may leak from your breasts. This is the first milk you are producing for your baby.  Your belly button may stick out.  You may notice more swelling in your hands,  face, or ankles.  You may have increased tingling or numbness in your hands, arms, and legs. The skin on your belly may also feel numb.  You may feel short of breath because of your expanding uterus.  You may have more problems sleeping. This can be caused by the size of your belly, increased need to urinate, and an increase in your body's metabolism.  You may notice the fetus "dropping," or moving lower in your abdomen (lightening).  You may have increased vaginal discharge.  You may notice your joints feel loose and you may have pain around your pelvic bone.  What to expect at prenatal visits You will have prenatal exams every 2 weeks until week 36. Then you will have weekly prenatal exams. During a routine prenatal visit:  You will be weighed to make sure you and the baby are growing normally.  Your blood pressure will be taken.  Your abdomen will be measured to track your baby's growth.  The fetal heartbeat will be listened to.  Any test results from the previous visit will be discussed.  You may have a cervical check near your due date to see if your cervix has softened or thinned (effaced).  You will be tested for Group B streptococcus. This happens between 35 and 37 weeks.  Your health care provider may ask you:  What your birth plan is.  How you are feeling.  If you are feeling the baby move.  If you have had   any abnormal symptoms, such as leaking fluid, bleeding, severe headaches, or abdominal cramping.  If you are using any tobacco products, including cigarettes, chewing tobacco, and electronic cigarettes.  If you have any questions.  Other tests or screenings that may be performed during your third trimester include:  Blood tests that check for low iron levels (anemia).  Fetal testing to check the health, activity level, and growth of the fetus. Testing is done if you have certain medical conditions or if there are problems during the  pregnancy.  Nonstress test (NST). This test checks the health of your baby to make sure there are no signs of problems, such as the baby not getting enough oxygen. During this test, a belt is placed around your belly. The baby is made to move, and its heart rate is monitored during movement.  What is false labor? False labor is a condition in which you feel small, irregular tightenings of the muscles in the womb (contractions) that usually go away with rest, changing position, or drinking water. These are called Braxton Hicks contractions. Contractions may last for hours, days, or even weeks before true labor sets in. If contractions come at regular intervals, become more frequent, increase in intensity, or become painful, you should see your health care provider. What are the signs of labor?  Abdominal cramps.  Regular contractions that start at 10 minutes apart and become stronger and more frequent with time.  Contractions that start on the top of the uterus and spread down to the lower abdomen and back.  Increased pelvic pressure and dull back pain.  A watery or bloody mucus discharge that comes from the vagina.  Leaking of amniotic fluid. This is also known as your "water breaking." It could be a slow trickle or a gush. Let your health care provider know if it has a color or strange odor. If you have any of these signs, call your health care provider right away, even if it is before your due date. Follow these instructions at home: Medicines  Follow your health care provider's instructions regarding medicine use. Specific medicines may be either safe or unsafe to take during pregnancy.  Take a prenatal vitamin that contains at least 600 micrograms (mcg) of folic acid.  If you develop constipation, try taking a stool softener if your health care provider approves. Eating and drinking  Eat a balanced diet that includes fresh fruits and vegetables, whole grains, good sources of protein  such as meat, eggs, or tofu, and low-fat dairy. Your health care provider will help you determine the amount of weight gain that is right for you.  Avoid raw meat and uncooked cheese. These carry germs that can cause birth defects in the baby.  If you have low calcium intake from food, talk to your health care provider about whether you should take a daily calcium supplement.  Eat four or five small meals rather than three large meals a day.  Limit foods that are high in fat and processed sugars, such as fried and sweet foods.  To prevent constipation: ? Drink enough fluid to keep your urine clear or pale yellow. ? Eat foods that are high in fiber, such as fresh fruits and vegetables, whole grains, and beans. Activity  Exercise only as directed by your health care provider. Most women can continue their usual exercise routine during pregnancy. Try to exercise for 30 minutes at least 5 days a week. Stop exercising if you experience uterine contractions.  Avoid heavy   lifting.  Do not exercise in extreme heat or humidity, or at high altitudes.  Wear low-heel, comfortable shoes.  Practice good posture.  You may continue to have sex unless your health care provider tells you otherwise. Relieving pain and discomfort  Take frequent breaks and rest with your legs elevated if you have leg cramps or low back pain.  Take warm sitz baths to soothe any pain or discomfort caused by hemorrhoids. Use hemorrhoid cream if your health care provider approves.  Wear a good support bra to prevent discomfort from breast tenderness.  If you develop varicose veins: ? Wear support pantyhose or compression stockings as told by your healthcare provider. ? Elevate your feet for 15 minutes, 3-4 times a day. Prenatal care  Write down your questions. Take them to your prenatal visits.  Keep all your prenatal visits as told by your health care provider. This is important. Safety  Wear your seat belt at  all times when driving.  Make a list of emergency phone numbers, including numbers for family, friends, the hospital, and police and fire departments. General instructions  Avoid cat litter boxes and soil used by cats. These carry germs that can cause birth defects in the baby. If you have a cat, ask someone to clean the litter box for you.  Do not travel far distances unless it is absolutely necessary and only with the approval of your health care provider.  Do not use hot tubs, steam rooms, or saunas.  Do not drink alcohol.  Do not use any products that contain nicotine or tobacco, such as cigarettes and e-cigarettes. If you need help quitting, ask your health care provider.  Do not use any medicinal herbs or unprescribed drugs. These chemicals affect the formation and growth of the baby.  Do not douche or use tampons or scented sanitary pads.  Do not cross your legs for long periods of time.  To prepare for the arrival of your baby: ? Take prenatal classes to understand, practice, and ask questions about labor and delivery. ? Make a trial run to the hospital. ? Visit the hospital and tour the maternity area. ? Arrange for maternity or paternity leave through employers. ? Arrange for family and friends to take care of pets while you are in the hospital. ? Purchase a rear-facing car seat and make sure you know how to install it in your car. ? Pack your hospital bag. ? Prepare the baby's nursery. Make sure to remove all pillows and stuffed animals from the baby's crib to prevent suffocation.  Visit your dentist if you have not gone during your pregnancy. Use a soft toothbrush to brush your teeth and be gentle when you floss. Contact a health care provider if:  You are unsure if you are in labor or if your water has broken.  You become dizzy.  You have mild pelvic cramps, pelvic pressure, or nagging pain in your abdominal area.  You have lower back pain.  You have persistent  nausea, vomiting, or diarrhea.  You have an unusual or bad smelling vaginal discharge.  You have pain when you urinate. Get help right away if:  Your water breaks before 37 weeks.  You have regular contractions less than 5 minutes apart before 37 weeks.  You have a fever.  You are leaking fluid from your vagina.  You have spotting or bleeding from your vagina.  You have severe abdominal pain or cramping.  You have rapid weight loss or weight gain.    You have shortness of breath with chest pain.  You notice sudden or extreme swelling of your face, hands, ankles, feet, or legs.  Your baby makes fewer than 10 movements in 2 hours.  You have severe headaches that do not go away when you take medicine.  You have vision changes. Summary  The third trimester is from week 28 through week 40, months 7 through 9. The third trimester is a time when the unborn baby (fetus) is growing rapidly.  During the third trimester, your discomfort may increase as you and your baby continue to gain weight. You may have abdominal, leg, and back pain, sleeping problems, and an increased need to urinate.  During the third trimester your breasts will keep growing and they will continue to become tender. A yellow fluid (colostrum) may leak from your breasts. This is the first milk you are producing for your baby.  False labor is a condition in which you feel small, irregular tightenings of the muscles in the womb (contractions) that eventually go away. These are called Braxton Hicks contractions. Contractions may last for hours, days, or even weeks before true labor sets in.  Signs of labor can include: abdominal cramps; regular contractions that start at 10 minutes apart and become stronger and more frequent with time; watery or bloody mucus discharge that comes from the vagina; increased pelvic pressure and dull back pain; and leaking of amniotic fluid. This information is not intended to replace advice  given to you by your health care provider. Make sure you discuss any questions you have with your health care provider. Document Released: 01/06/2001 Document Revised: 06/20/2015 Document Reviewed: 03/15/2012 Elsevier Interactive Patient Education  2017 [employer]  

## 2016-06-09 ENCOUNTER — Ambulatory Visit (INDEPENDENT_AMBULATORY_CARE_PROVIDER_SITE_OTHER): Payer: Medicaid Other | Admitting: Obstetrics & Gynecology

## 2016-06-09 ENCOUNTER — Encounter: Payer: Self-pay | Admitting: Obstetrics & Gynecology

## 2016-06-09 VITALS — BP 90/52 | HR 102 | Wt 130.0 lb

## 2016-06-09 DIAGNOSIS — Z1389 Encounter for screening for other disorder: Secondary | ICD-10-CM

## 2016-06-09 DIAGNOSIS — Z331 Pregnant state, incidental: Secondary | ICD-10-CM

## 2016-06-09 DIAGNOSIS — Z3403 Encounter for supervision of normal first pregnancy, third trimester: Secondary | ICD-10-CM

## 2016-06-09 LAB — POCT URINALYSIS DIPSTICK
Glucose, UA: NEGATIVE
KETONES UA: NEGATIVE
Nitrite, UA: NEGATIVE
Protein, UA: NEGATIVE
RBC UA: NEGATIVE

## 2016-06-09 NOTE — Progress Notes (Signed)
G1P0 7773w4d Estimated Date of Delivery: 07/17/16  Blood pressure (!) 90/52, pulse (!) 102, weight 130 lb (59 kg), last menstrual period 10/11/2015.   BP weight and urine results all reviewed and noted.  Please refer to the obstetrical flow sheet for the fundal height and fetal heart rate documentation:  Patient reports good fetal movement, denies any bleeding and no rupture of membranes symptoms or regular contractions. Patient is without complaints. All questions were answered.  Orders Placed This Encounter  Procedures  . POCT urinalysis dipstick    Plan:  Continued routine obstetrical care,   Return in about 2 weeks (around 06/23/2016) for LROB.

## 2016-06-23 ENCOUNTER — Ambulatory Visit (INDEPENDENT_AMBULATORY_CARE_PROVIDER_SITE_OTHER): Payer: Medicaid Other | Admitting: Advanced Practice Midwife

## 2016-06-23 ENCOUNTER — Encounter: Payer: Self-pay | Admitting: Advanced Practice Midwife

## 2016-06-23 VITALS — BP 100/50 | HR 72 | Wt 133.3 lb

## 2016-06-23 DIAGNOSIS — Z3403 Encounter for supervision of normal first pregnancy, third trimester: Secondary | ICD-10-CM

## 2016-06-23 DIAGNOSIS — Z3A36 36 weeks gestation of pregnancy: Secondary | ICD-10-CM

## 2016-06-23 DIAGNOSIS — Z3483 Encounter for supervision of other normal pregnancy, third trimester: Secondary | ICD-10-CM

## 2016-06-23 DIAGNOSIS — Z1389 Encounter for screening for other disorder: Secondary | ICD-10-CM

## 2016-06-23 DIAGNOSIS — Z331 Pregnant state, incidental: Secondary | ICD-10-CM

## 2016-06-23 LAB — POCT URINALYSIS DIPSTICK
Blood, UA: NEGATIVE
GLUCOSE UA: NEGATIVE
KETONES UA: NEGATIVE
LEUKOCYTES UA: NEGATIVE
NITRITE UA: NEGATIVE
Protein, UA: NEGATIVE

## 2016-06-23 LAB — OB RESULTS CONSOLE GBS: GBS: POSITIVE

## 2016-06-23 NOTE — Progress Notes (Signed)
G1P0 3758w4d Estimated Date of Delivery: 07/17/16  Weight 133 lb 4.8 oz (60.5 kg), last menstrual period 10/11/2015.   BP weight and urine results all reviewed and noted.  Please refer to the obstetrical flow sheet for the fundal height and fetal heart rate documentation:  Patient reports good fetal movement, denies any bleeding and no rupture of membranes symptoms or regular contractions. Patient is without complaints. All questions were answered.  SVE: 1/0/-3 vtx  Orders Placed This Encounter  Procedures  . POCT urinalysis dipstick    Plan:  Continued routine weekly obstetrical care.  Jonetta Speakarol Giovani Neumeister SNM  I have seen and examined this patient and agree with the management plan.

## 2016-06-23 NOTE — Patient Instructions (Signed)

## 2016-06-25 LAB — GC/CHLAMYDIA PROBE AMP
CHLAMYDIA, DNA PROBE: NEGATIVE
NEISSERIA GONORRHOEAE BY PCR: NEGATIVE

## 2016-06-26 ENCOUNTER — Inpatient Hospital Stay (HOSPITAL_COMMUNITY)
Admission: AD | Admit: 2016-06-26 | Discharge: 2016-06-29 | DRG: 775 | Disposition: A | Discharge status: Home / Self Care | Payer: Medicaid Other | Source: Ambulatory Visit | Attending: Obstetrics and Gynecology | Admitting: Obstetrics and Gynecology

## 2016-06-26 ENCOUNTER — Telehealth: Payer: Self-pay | Admitting: *Deleted

## 2016-06-26 ENCOUNTER — Encounter (HOSPITAL_COMMUNITY): Payer: Self-pay

## 2016-06-26 DIAGNOSIS — O4202 Full-term premature rupture of membranes, onset of labor within 24 hours of rupture: Secondary | ICD-10-CM | POA: Diagnosis not present

## 2016-06-26 DIAGNOSIS — O479 False labor, unspecified: Secondary | ICD-10-CM

## 2016-06-26 DIAGNOSIS — Z87891 Personal history of nicotine dependence: Secondary | ICD-10-CM

## 2016-06-26 DIAGNOSIS — Z880 Allergy status to penicillin: Secondary | ICD-10-CM | POA: Diagnosis not present

## 2016-06-26 DIAGNOSIS — O99824 Streptococcus B carrier state complicating childbirth: Secondary | ICD-10-CM | POA: Diagnosis present

## 2016-06-26 DIAGNOSIS — B9689 Other specified bacterial agents as the cause of diseases classified elsewhere: Secondary | ICD-10-CM

## 2016-06-26 DIAGNOSIS — Z3493 Encounter for supervision of normal pregnancy, unspecified, third trimester: Secondary | ICD-10-CM | POA: Diagnosis present

## 2016-06-26 DIAGNOSIS — O4292 Full-term premature rupture of membranes, unspecified as to length of time between rupture and onset of labor: Secondary | ICD-10-CM | POA: Diagnosis present

## 2016-06-26 DIAGNOSIS — N760 Acute vaginitis: Secondary | ICD-10-CM | POA: Diagnosis not present

## 2016-06-26 DIAGNOSIS — O4290 Premature rupture of membranes, unspecified as to length of time between rupture and onset of labor, unspecified weeks of gestation: Secondary | ICD-10-CM | POA: Diagnosis present

## 2016-06-26 DIAGNOSIS — Z3A37 37 weeks gestation of pregnancy: Secondary | ICD-10-CM

## 2016-06-26 DIAGNOSIS — D573 Sickle-cell trait: Secondary | ICD-10-CM | POA: Diagnosis present

## 2016-06-26 DIAGNOSIS — O9902 Anemia complicating childbirth: Secondary | ICD-10-CM | POA: Diagnosis present

## 2016-06-26 DIAGNOSIS — O9989 Other specified diseases and conditions complicating pregnancy, childbirth and the puerperium: Secondary | ICD-10-CM

## 2016-06-26 LAB — URINALYSIS, MICROSCOPIC (REFLEX)

## 2016-06-26 LAB — URINALYSIS, ROUTINE W REFLEX MICROSCOPIC
Bilirubin Urine: NEGATIVE
Glucose, UA: NEGATIVE mg/dL
KETONES UR: 15 mg/dL — AB
NITRITE: NEGATIVE
PROTEIN: NEGATIVE mg/dL
Specific Gravity, Urine: 1.015 (ref 1.005–1.030)
pH: 6 (ref 5.0–8.0)

## 2016-06-26 LAB — POCT FERN TEST
POCT FERN TEST: NEGATIVE
POCT FERN TEST: POSITIVE

## 2016-06-26 LAB — CBC WITH DIFFERENTIAL/PLATELET
BASOS PCT: 0 %
Basophils Absolute: 0 10*3/uL (ref 0.0–0.1)
Eosinophils Absolute: 0.1 10*3/uL (ref 0.0–0.7)
Eosinophils Relative: 1 %
HEMATOCRIT: 31.8 % — AB (ref 36.0–46.0)
Hemoglobin: 11.3 g/dL — ABNORMAL LOW (ref 12.0–15.0)
LYMPHS ABS: 1.7 10*3/uL (ref 0.7–4.0)
Lymphocytes Relative: 12 %
MCH: 29.6 pg (ref 26.0–34.0)
MCHC: 35.5 g/dL (ref 30.0–36.0)
MCV: 83.2 fL (ref 78.0–100.0)
MONO ABS: 0.7 10*3/uL (ref 0.1–1.0)
Monocytes Relative: 5 %
NEUTROS PCT: 82 %
Neutro Abs: 11.7 10*3/uL — ABNORMAL HIGH (ref 1.7–7.7)
PLATELETS: 284 10*3/uL (ref 150–400)
RBC: 3.82 MIL/uL — AB (ref 3.87–5.11)
RDW: 13.9 % (ref 11.5–15.5)
WBC: 14.2 10*3/uL — ABNORMAL HIGH (ref 4.0–10.5)

## 2016-06-26 LAB — WET PREP, GENITAL
Sperm: NONE SEEN
TRICH WET PREP: NONE SEEN
YEAST WET PREP: NONE SEEN

## 2016-06-26 MED ORDER — LACTATED RINGERS IV SOLN
500.0000 mL | INTRAVENOUS | Status: DC | PRN
  Administered 2016-06-27: 500 mL via INTRAVENOUS

## 2016-06-26 MED ORDER — ACETAMINOPHEN 325 MG PO TABS
650.0000 mg | ORAL_TABLET | Freq: Four times a day (QID) | ORAL | Status: DC | PRN
  Administered 2016-06-26: 650 mg via ORAL
  Filled 2016-06-26: qty 2

## 2016-06-26 MED ORDER — SOD CITRATE-CITRIC ACID 500-334 MG/5ML PO SOLN: 30.0000 mL | ORAL | Status: DC | PRN

## 2016-06-26 MED ORDER — VANCOMYCIN HCL IN DEXTROSE 1-5 GM/200ML-% IV SOLN
1000.0000 mg | Freq: Two times a day (BID) | INTRAVENOUS | Status: DC
  Administered 2016-06-26: 1000 mg via INTRAVENOUS
  Filled 2016-06-26 (×2): qty 200

## 2016-06-26 MED ORDER — OXYTOCIN 40 UNITS IN LACTATED RINGERS INFUSION - SIMPLE MED: 1.0000 m[IU]/min | INTRAVENOUS | Status: DC

## 2016-06-26 MED ORDER — OXYTOCIN BOLUS FROM INFUSION
500.0000 mL | Freq: Once | INTRAVENOUS | Status: AC
  Administered 2016-06-27: 500 mL via INTRAVENOUS

## 2016-06-26 MED ORDER — LACTATED RINGERS IV SOLN: INTRAVENOUS | Status: DC

## 2016-06-26 MED ORDER — LACTATED RINGERS IV SOLN
INTRAVENOUS | Status: DC
  Administered 2016-06-26: 21:00:00 via INTRAVENOUS

## 2016-06-26 MED ORDER — OXYTOCIN 40 UNITS IN LACTATED RINGERS INFUSION - SIMPLE MED: 2.5000 [IU]/h | INTRAVENOUS | Status: DC

## 2016-06-26 MED ORDER — ONDANSETRON HCL 4 MG/2ML IJ SOLN: 4.0000 mg | Freq: Four times a day (QID) | INTRAMUSCULAR | Status: DC | PRN

## 2016-06-26 MED ORDER — LACTATED RINGERS IV BOLUS (SEPSIS)
1000.0000 mL | Freq: Once | INTRAVENOUS | Status: AC
  Administered 2016-06-26: 1000 mL via INTRAVENOUS

## 2016-06-26 MED ORDER — METRONIDAZOLE 500 MG PO TABS: 500.0000 mg | ORAL_TABLET | Freq: Three times a day (TID) | ORAL | 0 refills | Status: AC

## 2016-06-26 MED ORDER — LIDOCAINE HCL (PF) 1 % IJ SOLN
30.0000 mL | INTRAMUSCULAR | Status: AC | PRN
  Administered 2016-06-27: 30 mL via SUBCUTANEOUS
  Filled 2016-06-26 (×2): qty 30

## 2016-06-26 MED ORDER — OXYCODONE-ACETAMINOPHEN 5-325 MG PO TABS: 2.0000 | ORAL_TABLET | ORAL | Status: DC | PRN

## 2016-06-26 MED ORDER — TERBUTALINE SULFATE 1 MG/ML IJ SOLN
0.2500 mg | Freq: Once | INTRAMUSCULAR | Status: AC | PRN
  Administered 2016-06-27: 0.25 mg via SUBCUTANEOUS
  Filled 2016-06-26: qty 1

## 2016-06-26 MED ORDER — OXYCODONE-ACETAMINOPHEN 5-325 MG PO TABS: 1.0000 | ORAL_TABLET | ORAL | Status: DC | PRN

## 2016-06-26 NOTE — H&P (Signed)
LABOR AND DELIVERY ADMISSION HISTORY AND PHYSICAL NOTE  Maria Henderson is a 21 y.o. female G1P0 with IUP at [redacted]w[redacted]d by LMP consistent with 7 week ultrasound presenting for contractions that began yesterday.  The contractions began increasing in frequency to every 10 min earlier today and were occurring every 6 mins when she arrived to the hospital.  The contractions are currently occurring every 5 mins.    She reports seeing a mucus discharge and noted a gush of fluid during her physical exam when she arrived.    She reports +FM, + contractions, No LOF, no VB, no blurry vision, headaches or peripheral edema, and RUQ pain.  She plans on breastfeeding.   Prenatal History/Complications:  Past Medical History: Past Medical History:  Diagnosis Date  . Medical history non-contributory     Past Surgical History: Past Surgical History:  Procedure Laterality Date  . arm surgery Right    broke arm at 21 years old  . WISDOM TOOTH EXTRACTION      Obstetrical History: OB History    Gravida Para Term Preterm AB Living   1             SAB TAB Ectopic Multiple Live Births                  Social History: Social History   Social History  . Marital status: Single    Spouse name: N/A  . Number of children: N/A  . Years of education: N/A   Social History Main Topics  . Smoking status: Former Smoker    Years: 1.00    Types: Cigarettes  . Smokeless tobacco: Never Used  . Alcohol use No     Comment: not now  . Drug use: No  . Sexual activity: Yes    Birth control/ protection: None   Other Topics Concern  . None   Social History Narrative  . None    Family History: Family History  Problem Relation Age of Onset  . Diabetes Paternal Grandmother   . Breast cancer Mother     Allergies: Allergies  Allergen Reactions  . Penicillins Anaphylaxis and Swelling    Has patient had a PCN reaction causing immediate rash, facial/tongue/throat swelling, SOB or lightheadedness with  hypotension: Yes Has patient had a PCN reaction causing severe rash involving mucus membranes or skin necrosis: Yes Has patient had a PCN reaction that required hospitalization: No Has patient had a PCN reaction occurring within the last 10 years: No If all of the above answers are "NO", then may proceed with Cephalosporin use.   [name] Extract Other (See Comments)    Mouth ulcers    Prescriptions Prior to Admission  Medication Sig Dispense Refill Last Dose  . acetaminophen (TYLENOL) 325 MG tablet Take 325 mg by mouth every 6 (six) hours as needed for mild pain or headache.   Past Month at Unknown time  . Pediatric Multiple Vit-C-FA (FLINSTONES GUMMIES OMEGA-3 DHA PO) Take 2 each by mouth at bedtime.    06/25/2016 at Unknown time     Review of Systems   All systems reviewed and negative except as stated in HPI  Physical Exam Blood pressure 113/73, pulse (!) 125, temperature 99.4 F (37.4 C), temperature source Oral, resp. rate 18, height 5\' 3"  (1.6 m), weight 59.9 kg (132 lb), last menstrual period 10/11/2015, SpO2 100 %. General appearance: alert and cooperative Lungs: clear to auscultation bilaterally Heart: regular rate and rhythm Abdomen: soft, non-tender; bowel sounds  normal Extremities: No calf swelling or tenderness Presentation: cephalic Fetal monitoring:  130 bpm, mod variability, acels, no decels Uterine activity: Irregular contractions q2-4568m   Dilation: 1 Effacement (%): 90 Station: 0 Exam by:: Artelia LarocheM. Williams CNM   Prenatal labs: ABO, Rh: B/Positive/-- (11/28 1604) Antibody: Negative (03/27 0932) Rubella:  RPR: Non Reactive (03/27 0932)  HBsAg: Negative (11/28 1604)  HIV: Non Reactive (03/27 0932)  GBS: Positive (05/29 0000)  Glucola 66/159/166 Genetic screening:  negative Anatomy US:  Normal female  Prenatal Transfer Tool  Maternal Diabetes: No Genetic Screening: Negative Maternal Ultrasounds/Referrals: Normal Fetal Ultrasounds or other Referrals:   None Maternal Substance Abuse:  No Significant Maternal Medications:  None Significant Maternal Lab Results: GBS+  Results for orders placed or performed during the hospital encounter of 06/26/16 (from the past 24 hour(s))  Urinalysis, Routine w reflex microscopic   Collection Time: 06/26/16  7:30 PM  Result Value Ref Range   Color, Urine YELLOW YELLOW   APPearance CLEAR CLEAR   Specific Gravity, Urine 1.015 1.005 - 1.030   pH 6.0 5.0 - 8.0   Glucose, UA NEGATIVE NEGATIVE mg/dL   Hgb urine dipstick SMALL (A) NEGATIVE   Bilirubin Urine NEGATIVE NEGATIVE   Ketones, ur 15 (A) NEGATIVE mg/dL   Protein, ur NEGATIVE NEGATIVE mg/dL   Nitrite NEGATIVE NEGATIVE   Leukocytes, UA LARGE (A) NEGATIVE  Urinalysis, Microscopic (reflex)   Collection Time: 06/26/16  7:30 PM  Result Value Ref Range   RBC / HPF 6-30 0 - 5 RBC/hpf   WBC, UA TOO NUMEROUS TO COUNT 0 - 5 WBC/hpf   Bacteria, UA MANY (A) NONE SEEN   Squamous Epithelial / LPF 6-30 (A) NONE SEEN   Urine-Other MUCOUS PRESENT   Wet prep, genital   Collection Time: 06/26/16  8:08 PM  Result Value Ref Range   Yeast Wet Prep HPF POC NONE SEEN NONE SEEN   Trich, Wet Prep NONE SEEN NONE SEEN   Clue Cells Wet Prep HPF POC PRESENT (A) NONE SEEN   WBC, Wet Prep HPF POC MODERATE (A) NONE SEEN   Sperm NONE SEEN   CBC with Differential   Collection Time: 06/26/16  8:20 PM  Result Value Ref Range   WBC 14.2 (H) 4.0 - 10.5 K/uL   RBC 3.82 (L) 3.87 - 5.11 MIL/uL   Hemoglobin 11.3 (L) 12.0 - 15.0 g/dL   HCT 16.131.8 (L) 09.636.0 - 04.546.0 %   MCV 83.2 78.0 - 100.0 fL   MCH 29.6 26.0 - 34.0 pg   MCHC 35.5 30.0 - 36.0 g/dL   RDW 40.913.9 81.111.5 - 91.415.5 %   Platelets 284 150 - 400 K/uL   Neutrophils Relative % 82 %   Neutro Abs 11.7 (H) 1.7 - 7.7 K/uL   Lymphocytes Relative 12 %   Lymphs Abs 1.7 0.7 - 4.0 K/uL   Monocytes Relative 5 %   Monocytes Absolute 0.7 0.1 - 1.0 K/uL   Eosinophils Relative 1 %   Eosinophils Absolute 0.1 0.0 - 0.7 K/uL   Basophils  Relative 0 %   Basophils Absolute 0.0 0.0 - 0.1 K/uL  Fern Test   Collection Time: 06/26/16  8:59 PM  Result Value Ref Range   POCT Fern Test Negative = intact amniotic membranes   Fern Test   Collection Time: 06/26/16 10:33 PM  Result Value Ref Range   POCT Fern Test Positive = ruptured amniotic membanes     Patient Active Problem List   Diagnosis Date Noted  .  PROM (premature rupture of membranes) 06/26/2016  . Sickle cell trait (HCC) 12/26/2015  . Supervision of normal first pregnancy 12/24/2015    Assessment:  Sunnie Odden is a [redacted]w[redacted]d and G1P0 that is admitted for contractions and PROM.   #Labor: ruptured, started cytotec - GBS positive/PCN allergic, start vanc #Pain: Planning for epidural #FWB:  Category I tracing #ID:  GBS negative #MOF: breast #MOC: undecided, considering IUD (Mirena)  Jeanann Lewandowsky, [place] PA-S  Howard Pouch, MD 06/27/2016, 12:17 AM  OB FELLOW HISTORY AND PHYSICAL ATTESTATION  I have seen and examined this patient; I agree with above documentation in the resident's note.    Jen Mow, DO OB Fellow 06/27/2016, 4:00 AM

## 2016-06-26 NOTE — MAU Note (Signed)
Pt reports having ctx q 5-6 min on and off all afternoon. Reports her mucus plug came out and she is still "dripping" fluid. Good fetal movement reported

## 2016-06-26 NOTE — Telephone Encounter (Signed)
Patient called stating she lost her mucous plug last night and since then has been having contractions every 10 minutes a part. She is not bleeding but states she is wearing a panty liner because she is wet and when she coughs, fluid comes out. [facility] Verbalized understanding.

## 2016-06-26 NOTE — MAU Provider Note (Signed)
Patient Maria Henderson is a 21 y.o.  G1P0 at [redacted]w[redacted]d Here with complaints of leaking of fluid and contractions. She denies bleeding or decreased fetal movements.  History     CSN: [id number]  Arrival date and time: 06/26/16 1851   First Provider Initiated Contact with Patient 06/26/16 2015      Chief Complaint  Patient presents with  . Contractions  . Vaginal Discharge   Vaginal Discharge  The patient's primary symptoms include vaginal discharge. This is a recurrent problem. The current episode started in the past 7 days. The problem occurs intermittently. Progression since onset: more of a yellow color now. Associated symptoms include abdominal pain. Pertinent negatives include no back pain, constipation, diarrhea, frequency, headaches, urgency or vomiting. Associated symptoms comments: contractions. The vaginal discharge was mucoid and yellow. There has been no bleeding. She has not been passing clots. She has not been passing tissue. Nothing aggravates the symptoms. She has tried nothing for the symptoms.    OB History    Gravida Para Term Preterm AB Living   1             SAB TAB Ectopic Multiple Live Births                  Past Medical History:  Diagnosis Date  . Medical history non-contributory     Past Surgical History:  Procedure Laterality Date  . arm surgery Right    broke arm at 21 years old  . WISDOM TOOTH EXTRACTION      Family History  Problem Relation Age of Onset  . Diabetes Paternal Grandmother   . Breast cancer Mother     Social History  Substance Use Topics  . Smoking status: Former Smoker    Years: 1.00    Types: Cigarettes  . Smokeless tobacco: Never Used  . Alcohol use No     Comment: not now    Allergies:  Allergies  Allergen Reactions  . Penicillins Anaphylaxis and Swelling    Has patient had a PCN reaction causing immediate rash, facial/tongue/throat swelling, SOB or lightheadedness with hypotension: Yes Has patient had a PCN  reaction causing severe rash involving mucus membranes or skin necrosis: Yes Has patient had a PCN reaction that required hospitalization: No Has patient had a PCN reaction occurring within the last 10 years: No If all of the above answers are "NO", then may proceed with Cephalosporin use.   [name] Extract Other (See Comments)    Mouth ulcers    Prescriptions Prior to Admission  Medication Sig Dispense Refill Last Dose  . acetaminophen (TYLENOL) 325 MG tablet Take 325 mg by mouth every 6 (six) hours as needed for mild pain or headache.   Past Month at Unknown time  . Pediatric Multiple Vit-C-FA (FLINSTONES GUMMIES OMEGA-3 DHA PO) Take 2 each by mouth at bedtime.    06/25/2016 at Unknown time    Review of Systems  Gastrointestinal: Positive for abdominal pain. Negative for constipation, diarrhea and vomiting.  Genitourinary: Positive for vaginal discharge. Negative for frequency and urgency.  Musculoskeletal: Negative for back pain.  Neurological: Negative for headaches.   Physical Exam   Blood pressure 116/72, pulse (!) 116, temperature 98.7 F (37.1 C), temperature source Oral, resp. rate 18, height 5\' 3"  (1.6 m), weight 132 lb 0.6 oz (59.9 kg), last menstrual period 10/11/2015, SpO2 98 %.  Physical Exam  Constitutional: She is oriented to person, place, and time. She appears well-developed and well-nourished.  HENT:  Head: Normocephalic.  Neck: Normal range of motion.  Respiratory: Effort normal.  GI: Soft.  Genitourinary: Vagina normal.  Genitourinary Comments: NEFG; no lesions on vaginal walls. Cervix is pink with no lesions, copious yellow discharge in the vagina. No pooling. Cervix is one cm; 0 station, 50% effaced.   Musculoskeletal: Normal range of motion.  Neurological: She is alert and oriented to person, place, and time.  Skin: Skin is warm and dry.  Psychiatric: She has a normal mood and affect.    MAU Course  Procedures  MDM -NST: 150 bpm, moderate  variability, present acels, no decels, frequent contractions q 2-3 which patient is talking through -IV fluids as maternal pulse is elevated and fetal heart rate baseline is between 150-160 -CBC and CMP to rule out infection; white count is 14.2 but otherwise normal  -Fern negative ; - pooling wet prep: positive for bacterial vaginosis -GC CT cultures done on 5-29; negative  -tylenol as temperature increased from 98 to 99- Assessment and Plan   1. Bacterial vaginosis   2. Braxton Hick's contraction    Care of patient endorsed to Wynelle BourgeoisMarie Williams CNM at 2130    Charlesetta GaribaldiKathryn Lorraine Kooistra 06/26/2016, 8:16 PM   Rechecked cervix:   1cm/90%/0-+1/vertex  Liquid noted on exam, pooling  + ferning.  Will admit  Aviva SignsWilliams, Marie L, CNM

## 2016-06-27 ENCOUNTER — Inpatient Hospital Stay (HOSPITAL_COMMUNITY): Payer: Medicaid Other | Admitting: Anesthesiology

## 2016-06-27 ENCOUNTER — Encounter (HOSPITAL_COMMUNITY): Payer: Self-pay | Admitting: Anesthesiology

## 2016-06-27 DIAGNOSIS — O99824 Streptococcus B carrier state complicating childbirth: Secondary | ICD-10-CM

## 2016-06-27 DIAGNOSIS — Z3A37 37 weeks gestation of pregnancy: Secondary | ICD-10-CM

## 2016-06-27 DIAGNOSIS — O4202 Full-term premature rupture of membranes, onset of labor within 24 hours of rupture: Secondary | ICD-10-CM

## 2016-06-27 LAB — ABO/RH: ABO/RH(D): B POS

## 2016-06-27 LAB — TYPE AND SCREEN
ABO/RH(D): B POS
Antibody Screen: NEGATIVE

## 2016-06-27 LAB — STREP GP B NAA+RFLX: STREP GP B NAA+RFLX: POSITIVE — AB

## 2016-06-27 LAB — STREP GP B SUSCEPTIBILITY

## 2016-06-27 LAB — RPR: RPR Ser Ql: NONREACTIVE

## 2016-06-27 LAB — HIV ANTIBODY (ROUTINE TESTING W REFLEX): HIV Screen 4th Generation wRfx: NONREACTIVE

## 2016-06-27 MED ORDER — FENTANYL 2.5 MCG/ML BUPIVACAINE 1/10 % EPIDURAL INFUSION (WH - ANES)
INTRAMUSCULAR | Status: AC
  Filled 2016-06-27: qty 100

## 2016-06-27 MED ORDER — EPHEDRINE 5 MG/ML INJ
10.0000 mg | INTRAVENOUS | Status: DC | PRN
  Filled 2016-06-27: qty 2

## 2016-06-27 MED ORDER — ONDANSETRON HCL 4 MG PO TABS: 4.0000 mg | ORAL_TABLET | ORAL | Status: DC | PRN

## 2016-06-27 MED ORDER — WITCH HAZEL-GLYCERIN EX PADS: 1.0000 "application " | MEDICATED_PAD | CUTANEOUS | Status: DC | PRN

## 2016-06-27 MED ORDER — FENTANYL CITRATE (PF) 100 MCG/2ML IJ SOLN
100.0000 ug | Freq: Once | INTRAMUSCULAR | Status: AC
  Administered 2016-06-27: 100 ug via INTRAVENOUS

## 2016-06-27 MED ORDER — DIBUCAINE 1 % RE OINT: 1.0000 "application " | TOPICAL_OINTMENT | RECTAL | Status: DC | PRN

## 2016-06-27 MED ORDER — FENTANYL CITRATE (PF) 100 MCG/2ML IJ SOLN
INTRAMUSCULAR | Status: AC
Start: 2016-06-27 — End: 2016-06-27
  Administered 2016-06-27: 100 ug via INTRAVENOUS
  Filled 2016-06-27: qty 2

## 2016-06-27 MED ORDER — SIMETHICONE 80 MG PO CHEW: 80.0000 mg | CHEWABLE_TABLET | ORAL | Status: DC | PRN

## 2016-06-27 MED ORDER — ACETAMINOPHEN 325 MG PO TABS: 650.0000 mg | ORAL_TABLET | ORAL | Status: DC | PRN

## 2016-06-27 MED ORDER — TERBUTALINE SULFATE 1 MG/ML IJ SOLN
0.2500 mg | Freq: Once | INTRAMUSCULAR | Status: DC | PRN
  Filled 2016-06-27: qty 1

## 2016-06-27 MED ORDER — OXYTOCIN 40 UNITS IN LACTATED RINGERS INFUSION - SIMPLE MED
1.0000 m[IU]/min | INTRAVENOUS | Status: DC
  Administered 2016-06-27: 2 m[IU]/min via INTRAVENOUS
  Filled 2016-06-27: qty 1000

## 2016-06-27 MED ORDER — PHENYLEPHRINE 40 MCG/ML (10ML) SYRINGE FOR IV PUSH (FOR BLOOD PRESSURE SUPPORT)
PREFILLED_SYRINGE | INTRAVENOUS | Status: AC
  Filled 2016-06-27: qty 10

## 2016-06-27 MED ORDER — ONDANSETRON HCL 4 MG/2ML IJ SOLN: 4.0000 mg | INTRAMUSCULAR | Status: DC | PRN

## 2016-06-27 MED ORDER — DIPHENHYDRAMINE HCL 50 MG/ML IJ SOLN: 12.5000 mg | INTRAMUSCULAR | Status: DC | PRN

## 2016-06-27 MED ORDER — IBUPROFEN 600 MG PO TABS
600.0000 mg | ORAL_TABLET | Freq: Four times a day (QID) | ORAL | Status: DC
  Administered 2016-06-27 – 2016-06-29 (×8): 600 mg via ORAL
  Filled 2016-06-27 (×8): qty 1

## 2016-06-27 MED ORDER — FENTANYL CITRATE (PF) 100 MCG/2ML IJ SOLN
INTRAMUSCULAR | Status: AC
  Administered 2016-06-27: 100 ug
  Filled 2016-06-27: qty 2

## 2016-06-27 MED ORDER — PHENYLEPHRINE 40 MCG/ML (10ML) SYRINGE FOR IV PUSH (FOR BLOOD PRESSURE SUPPORT)
80.0000 ug | PREFILLED_SYRINGE | INTRAVENOUS | Status: DC | PRN
Start: 2016-06-27 — End: 2016-06-27
  Filled 2016-06-27: qty 5

## 2016-06-27 MED ORDER — BENZOCAINE-MENTHOL 20-0.5 % EX AERO: 1.0000 "application " | INHALATION_SPRAY | CUTANEOUS | Status: DC | PRN

## 2016-06-27 MED ORDER — PHENYLEPHRINE 40 MCG/ML (10ML) SYRINGE FOR IV PUSH (FOR BLOOD PRESSURE SUPPORT)
80.0000 ug | PREFILLED_SYRINGE | INTRAVENOUS | Status: DC | PRN
  Filled 2016-06-27: qty 5

## 2016-06-27 MED ORDER — FENTANYL 2.5 MCG/ML BUPIVACAINE 1/10 % EPIDURAL INFUSION (WH - ANES)
14.0000 mL/h | INTRAMUSCULAR | Status: DC | PRN
  Administered 2016-06-27: 13 mL/h via EPIDURAL

## 2016-06-27 MED ORDER — TETANUS-DIPHTH-ACELL PERTUSSIS 5-2.5-18.5 LF-MCG/0.5 IM SUSP: 0.5000 mL | Freq: Once | INTRAMUSCULAR | Status: DC

## 2016-06-27 MED ORDER — LACTATED RINGERS IV SOLN: 500.0000 mL | Freq: Once | INTRAVENOUS | Status: DC

## 2016-06-27 MED ORDER — COCONUT OIL OIL: 1.0000 "application " | TOPICAL_OIL | Status: DC | PRN

## 2016-06-27 MED ORDER — FENTANYL CITRATE (PF) 100 MCG/2ML IJ SOLN
100.0000 ug | INTRAMUSCULAR | Status: DC | PRN
  Filled 2016-06-27 (×2): qty 2

## 2016-06-27 MED ORDER — SENNOSIDES-DOCUSATE SODIUM 8.6-50 MG PO TABS
2.0000 | ORAL_TABLET | ORAL | Status: DC
  Administered 2016-06-27 – 2016-06-28 (×2): 2 via ORAL
  Filled 2016-06-27 (×2): qty 2

## 2016-06-27 MED ORDER — LIDOCAINE HCL (PF) 1 % IJ SOLN
INTRAMUSCULAR | Status: DC | PRN
  Administered 2016-06-27: 3 mL via EPIDURAL
  Administered 2016-06-27: 4 mL via EPIDURAL

## 2016-06-27 MED ORDER — PRENATAL MULTIVITAMIN CH
1.0000 | ORAL_TABLET | Freq: Every day | ORAL | Status: DC
  Administered 2016-06-27 – 2016-06-29 (×3): 1 via ORAL
  Filled 2016-06-27 (×3): qty 1

## 2016-06-27 MED ORDER — DIPHENHYDRAMINE HCL 25 MG PO CAPS: 25.0000 mg | ORAL_CAPSULE | Freq: Four times a day (QID) | ORAL | Status: DC | PRN

## 2016-06-27 NOTE — Anesthesia Postprocedure Evaluation (Signed)
Anesthesia Post Note  Patient: Maria RudeLatricia L Henderson  Procedure(s) Performed: * No procedures listed *     Patient location during evaluation: Mother Baby Anesthesia Type: Epidural Level of consciousness: awake Pain management: pain level controlled Vital Signs Assessment: post-procedure vital signs reviewed and stable Respiratory status: spontaneous breathing Cardiovascular status: stable Postop Assessment: no headache, no backache, epidural receding, patient able to bend at knees, no signs of nausea or vomiting and adequate PO intake Anesthetic complications: no    Last Vitals:  Vitals:   06/27/16 1225 06/27/16 1335  BP: 105/71 112/64  Pulse: (!) 115 (!) 110  Resp: 18 16  Temp: 37.6 C 37 C    Last Pain:  Vitals:   06/27/16 1335  TempSrc: Oral  PainSc: 0-No pain   Pain Goal: Patients Stated Pain Goal: 0 (06/26/16 2225)               Shateria Paternostro

## 2016-06-27 NOTE — Progress Notes (Signed)
Patient ID: Juliet RudeLatricia L Bohl, female   DOB: 05/14/1995, 21 y.o.   MRN: [id number]  Called to patient's room due to prolonged deceleration. Patient seen & examined. Patient had an episode of emesis, then immediately FHT went into a prolonged decel with bradycardia in 80-90s (mod variability, with slow recovery for about 10 minutes. Position changes were performed, IVF bolus, high flow [place] O2, and eventually terbutaline was given. FHT recovered to baseline with mod variability and accels.   O:  Vitals:   06/26/16 2225 06/26/16 2313 06/26/16 2318 06/27/16 0318  BP:  113/73  130/83  Pulse:  (!) 125  (!) 121  Resp:  18  20  Temp: 99.2 F (37.3 C) 99.4 F (37.4 C)    TempSrc: Oral Oral    SpO2:      Weight:   132 lb (59.9 kg)   Height:   5\' 3"  (1.6 m)     Dilation: 3 Effacement (%): 80 Cervical Position: Posterior Station: 0 Presentation: Vertex Exam by:: Dr Omer JackMumaw  (see above)  A/P: Continue expectant management Anticipate SVD

## 2016-06-27 NOTE — Anesthesia Preprocedure Evaluation (Addendum)
Anesthesia Evaluation  Patient identified by MRN, date of birth, ID band Patient awake    Reviewed: Allergy & Precautions, Patient's Chart, lab work & pertinent test results  Airway Mallampati: II  TM Distance: >3 FB Neck ROM: Full    Dental no notable dental hx. (+) Teeth Intact   Pulmonary former smoker,    Pulmonary exam normal breath sounds clear to auscultation       Cardiovascular negative cardio ROS Normal cardiovascular exam Rhythm:Regular Rate:Normal     Neuro/Psych negative neurological ROS  negative psych ROS   GI/Hepatic Neg liver ROS, GERD  ,  Endo/Other  negative endocrine ROS  Renal/GU negative Renal ROS  negative genitourinary   Musculoskeletal negative musculoskeletal ROS (+)   Abdominal   Peds  Hematology  (+) Sickle cell trait and anemia ,   Anesthesia Other Findings   Reproductive/Obstetrics (+) Pregnancy PTL PROM                            Anesthesia Physical Anesthesia Plan  ASA: II  Anesthesia Plan: Epidural   Post-op Pain Management:    Induction:   Airway Management Planned: Natural Airway  Additional Equipment:   Intra-op Plan:   Post-operative Plan:   Informed Consent: I have reviewed the patients History and Physical, chart, labs and discussed the procedure including the risks, benefits and alternatives for the proposed anesthesia with the patient or authorized representative who has indicated his/her understanding and acceptance.     Plan Discussed with: Anesthesiologist  Anesthesia Plan Comments:         Anesthesia Quick Evaluation

## 2016-06-27 NOTE — Anesthesia Procedure Notes (Signed)
Epidural Patient location during procedure: OB Start time: 06/27/2016 5:26 AM  Staffing Anesthesiologist: Mal AmabileFOSTER, Mairlyn Tegtmeyer Performed: anesthesiologist   Preanesthetic Checklist Completed: patient identified, site marked, surgical consent, pre-op evaluation, timeout performed, IV checked, risks and benefits discussed and monitors and equipment checked  Epidural Patient position: sitting Prep: site prepped and draped and DuraPrep Patient monitoring: continuous pulse ox and blood pressure Approach: midline Location: L3-L4 Injection technique: LOR air  Needle:  Needle type: Tuohy  Needle gauge: 17 G Needle length: 9 cm and 9 Needle insertion depth: 4 cm Catheter type: closed end flexible Catheter size: 19 Gauge Catheter at skin depth: 9 cm Test dose: negative and Other  Assessment Events: blood not aspirated, injection not painful, no injection resistance, negative IV test and no paresthesia  Additional Notes Patient identified. Risks and benefits discussed including failed block, incomplete  Pain control, post dural puncture headache, nerve damage, paralysis, blood pressure Changes, nausea, vomiting, reactions to medications-both toxic and allergic and post Partum back pain. All questions were answered. Patient expressed understanding and wished to proceed. Sterile technique was used throughout procedure. Epidural site was Dressed with sterile barrier dressing. No paresthesias, signs of intravascular injection Or signs of intrathecal spread were encountered.  Patient was more comfortable after the epidural was dosed. Please see RN's note for documentation of vital signs and FHR which are stable.

## 2016-06-27 NOTE — Lactation Note (Signed)
This note was copied from a baby's chart. Lactation Consultation Note  Patient Name: Maria Henderson HYQMV'HToday's Date: 06/27/2016 Reason for consult: Initial assessment [facility] is 4 hours old [facility] Baby was born at 1950w1d and weighed 7 lbs 2.6 oz at birth. Baby was skin-to-skin with mom when LC entered. Mom stated that baby had recently BF for 10-15 mins but then 'spit the nipple out.' Mom reported that she had a little stinging feeling in her nipple after the last BF but that it felt fine now. [place] Baby started to show feeding cues so tried latching baby on right breast in cross cradle hold but baby fell asleep and would not latch so dad did skin to skin with baby. Mom did some hand expressing on her right breast and a drop of colostrum was noted. RN was in room as well and stated that the baby was having a hard time keeping her temperature up but that the most recent check was at the lower end of wnl and that her BG was wnl (51 at 1447). Mom was concerned about baby being sleepy and not wanting to BF. Discussed normal newborn behavior. Mom encouraged to feed baby 8-12 times/24 hours and with feeding cues. Provided BF brochure, BF resources, and feeding log; mom made aware of O/P services, breastfeeding support groups, community resources, and our phone # for post-discharge questions. RN discussed possibly setting up a DEBP. Mom reports no questions at this time. Encouraged mom to ask for help as needed.    Maternal Data Has patient been taught Hand Expression?: Yes Does the patient have breastfeeding experience prior to this delivery?: No  Feeding Feeding Type: Breast Fed Length of feed: 2 min  LATCH Score/Interventions Latch: Grasps breast easily, tongue down, lips flanged, rhythmical sucking. Intervention(s): Skin to skin;Teach feeding cues Intervention(s): Adjust position;Assist with latch  Audible Swallowing: Spontaneous and  intermittent Intervention(s): Skin to skin;Hand expression  Type of Nipple: Everted at rest and after stimulation  Comfort (Breast/Nipple): Soft / non-tender     Hold (Positioning): Full assist, staff holds infant at breast  LATCH Score: 8  Lactation Tools Discussed/Used WIC Program: Yes [facility]([place])   Consult Status Consult Status: Follow-up Date: 06/28/16 Follow-up type: In-patient    Oneal GroutLaura C Alanya Vukelich 06/27/2016, 4:02 PM

## 2016-06-27 NOTE — Anesthesia Pain Management Evaluation Note (Signed)
  CRNA Pain Management Visit Note  Patient: Maria Henderson, 21 y.o., female  "Hello I am a member of the anesthesia team at [facility]. We have an anesthesia team available at all times to provide care throughout the hospital, including epidural management and anesthesia for C-section. I don't know your plan for the delivery whether it a natural birth, water birth, IV sedation, nitrous supplementation, doula or epidural, but we want to meet your pain goals."   1.Was your pain managed to your expectations on prior hospitalizations?   No prior hospitalizations  2.What is your expectation for pain management during this hospitalization?     Epidural  3.How can we help you reach that goal? unsure  Record the patient's initial score and the patient's pain goal.   Pain: 2  Pain Goal: 8 [facility] wants you to be able to say your pain was always managed very well.  Cephus ShellingBURGER,Tenisha Fleece 06/27/2016

## 2016-06-27 NOTE — Lactation Note (Signed)
This note was copied from a baby's chart. Lactation Consultation Note  Baby 11 hours old, 3153w1d and per RN has been sleepy, not able to latch, pushes off and on nipple. Noted short lingual frenulum and tongue thrusting.  Suggest discussing it w/ Ped MD. Maria LeisureBaby breastfed off and on with breast compression but did not sustain latch so attempted w/ #20NS. Baby would suck and few times and fall back asleep. Finger syringe feeding and bottle feeding was difficult due to disorganized suck. Baby was able to take approx 5 ml of Alimentum. Reviewed LPI feeding plan. Recommend breastfeeding first and then supplementing afterwards per guidelines. Post pump 4-6 times per day for 10-15 min. Give volume back to baby with the difference w/ formula. Demonstrated how to spoon feed. Mother states she has pumped today with little volume expressed. Encouraged mother.  Mother states she really would like to breastfeed.     Patient Name: Maria Henderson ZOXWR'UToday's Date: 06/27/2016 Reason for consult: Follow-up assessment   Maternal Data Has patient been taught Hand Expression?: Yes  Feeding Feeding Type: Breast Fed Length of feed: 10 min (off and on)  LATCH Score/Interventions Latch: Repeated attempts needed to sustain latch, nipple held in mouth throughout feeding, stimulation needed to elicit sucking reflex. Intervention(s): Skin to skin;Waking techniques Intervention(s): Adjust position;Assist with latch;Breast massage;Breast compression  Audible Swallowing: A few with stimulation Intervention(s): Skin to skin;Hand expression Intervention(s): Alternate breast massage;Hand expression  Type of Nipple: Everted at rest and after stimulation  Comfort (Breast/Nipple): Soft / non-tender     Hold (Positioning): Assistance needed to correctly position infant at breast and maintain latch.  LATCH Score: 7  Lactation Tools Discussed/Used Tools: Nipple Shields Nipple shield size: 20 Pump Review:  Setup, frequency, and cleaning;Milk Storage Initiated by::  Banker(RN) Date initiated:: 06/27/16   Consult Status Consult Status: Follow-up Date: 06/28/16 Follow-up type: In-patient    Maria Henderson, Maria Henderson 06/27/2016, 10:07 PM

## 2016-06-28 NOTE — Progress Notes (Signed)
Post Partum Day 1 Subjective: no complaints, up ad lib, voiding and tolerating PO  Objective: Blood pressure 101/62, pulse 90, temperature 98.2 F (36.8 C), temperature source Oral, resp. rate 18, height 5\' 3"  (1.6 m), weight 132 lb (59.9 kg), last menstrual period 10/11/2015, SpO2 100 %, unknown if currently breastfeeding.  Physical Exam:  General: alert, cooperative and no distress Lochia: appropriate Uterine Fundus: firm   Recent Labs  06/26/16 2020  HGB 11.3*  HCT 31.8*    Assessment/Plan: Plan for discharge tomorrow, Breastfeeding and Contraception IUD   LOS: 2 days   Cleone SlimCaroline Adedamola Seto SNM 06/28/2016, 8:43 AM

## 2016-06-29 MED ORDER — IBUPROFEN 600 MG PO TABS
600.0000 mg | ORAL_TABLET | Freq: Four times a day (QID) | ORAL | 0 refills | Status: DC
Start: 2016-06-29 — End: 2018-06-09

## 2016-06-29 NOTE — Discharge Summary (Signed)
OB Discharge Summary  Patient Name: Maria Henderson DOB: 03-08-1995 MRN: [id number]  Date of admission: 06/26/2016 Delivering MD: Donette Larry   Date of discharge: 06/29/2016  Admitting diagnosis: 37WKS CTX, MUCUS PLUG CAME OUT Intrauterine pregnancy: [redacted]w[redacted]d     Secondary diagnosis:Active Problems:   PROM (premature rupture of membranes)     Discharge diagnosis: Term Pregnancy Delivered                                                                       Complications: None  Hospital course:  Onset of Labor With Vaginal Delivery     21 y.o. yo G1P1001 at [redacted]w[redacted]d was admitted in Active Labor on 06/26/2016. Patient had an uncomplicated labor course as follows:  Membrane Rupture Time/Date: 10:23 PM ,06/26/2016   Intrapartum Procedures: Episiotomy: None [1]                                         Lacerations:  1st degree [2];Labial [10];Perineal [11]  Patient had a delivery of a Viable infant. 06/27/2016  Information for the patient's newborn:  Royce, Sciara Girl Katalena [[id number]]  Delivery Method: Vag-Spont    Pateint had an uncomplicated postpartum course.  She is ambulating, tolerating a regular diet, passing flatus, and urinating well. Patient is discharged home in stable condition on 06/29/16.   Physical exam  Vitals:   06/28/16 0150 06/28/16 0524 06/28/16 1749 06/29/16 0528  BP: (!) 105/59 101/62 109/62 106/62  Pulse: (!) 105 90 95 72  Resp: (!) 24 18 18 18   Temp: 98.9 F (37.2 C) 98.2 F (36.8 C) 98.2 F (36.8 C) 98.3 F (36.8 C)  TempSrc: Oral Oral  Oral  SpO2:   100%   Weight:      Height:       General: alert Lochia: appropriate Uterine Fundus: firm Incision: N/A DVT Evaluation: No evidence of DVT seen on physical exam. Labs: Lab Results  Component Value Date   WBC 14.2 (H) 06/26/2016   HGB 11.3 (L) 06/26/2016   HCT 31.8 (L) 06/26/2016   MCV 83.2 06/26/2016   PLT 284 06/26/2016   No flowsheet data found.  Discharge instruction: per After Visit  Summary and "Baby and Me Booklet".  After Visit Meds:  Allergies as of 06/29/2016      Reactions   Penicillins Anaphylaxis, Swelling   Has patient had a PCN reaction causing immediate rash, facial/tongue/throat swelling, SOB or lightheadedness with hypotension: Yes Has patient had a PCN reaction causing severe rash involving mucus membranes or skin necrosis: Yes Has patient had a PCN reaction that required hospitalization: No Has patient had a PCN reaction occurring within the last 10 years: No If all of the above answers are "NO", then may proceed with Cephalosporin use.   [place]wi Extract Other (See Comments)   Mouth ulcers      Medication List    TAKE these medications   acetaminophen 325 MG tablet Commonly known as:  TYLENOL Take 325 mg by mouth every 6 (six) hours as needed for mild pain or headache.   FLINSTONES GUMMIES OMEGA-3 DHA PO Take 2 each by  mouth at bedtime.   ibuprofen 600 MG tablet Commonly known as:  ADVIL,MOTRIN Take 1 tablet (600 mg total) by mouth every 6 (six) hours.   metroNIDAZOLE 500 MG tablet Commonly known as:  FLAGYL Take 1 tablet (500 mg total) by mouth 3 (three) times daily.       Diet: routine diet  Activity: Advance as tolerated. Pelvic rest for 6 weeks.   Outpatient follow up:4 weeks Follow up Appt:Future Appointments Date Time Provider Department Center  06/30/2016 12:00 PM Lazaro ArmsEure, Luther H, MD [facility]   Follow up visit: No Follow-up on file.  Postpartum contraception: IUD Mirena  Newborn Data: Live born female  Birth Weight: 7 lb 2.6 oz (3250 g) APGAR: 8, 9  Baby Feeding: Breast Disposition:home with mother   06/29/2016 Allie BossierMyra C Amere Bricco, MD

## 2016-06-29 NOTE — Progress Notes (Signed)
Post discharge chart review completed.  

## 2016-06-29 NOTE — Discharge Instructions (Signed)
Breastfeeding °Deciding to breastfeed is one of the best choices you can make for you and your baby. A change in hormones during pregnancy causes your breast tissue to grow and increases the number and size of your milk ducts. These hormones also allow proteins, sugars, and fats from your blood supply to make breast milk in your milk-producing glands. Hormones prevent breast milk from being released before your baby is born as well as prompt milk flow after birth. Once breastfeeding has begun, thoughts of your baby, as well as his or her sucking or crying, can stimulate the release of milk from your milk-producing glands. °Benefits of breastfeeding °For Your Baby °· Your first milk (colostrum) helps your baby's digestive system function better. °· There are antibodies in your milk that help your baby fight off infections. °· Your baby has a lower incidence of asthma, allergies, and sudden infant death syndrome. °· The nutrients in breast milk are better for your baby than infant formulas and are designed uniquely for your baby’s needs. °· Breast milk improves your baby's brain development. °· Your baby is less likely to develop other conditions, such as childhood obesity, asthma, or type 2 diabetes mellitus. ° °For You °· Breastfeeding helps to create a very special bond between you and your baby. °· Breastfeeding is convenient. Breast milk is always available at the correct temperature and costs nothing. °· Breastfeeding helps to burn calories and helps you lose the weight gained during pregnancy. °· Breastfeeding makes your uterus contract to its prepregnancy size faster and slows bleeding (lochia) after you give birth. °· Breastfeeding helps to lower your risk of developing type 2 diabetes mellitus, osteoporosis, and breast or ovarian cancer later in life. ° °Signs that your baby is hungry °Early Signs of Hunger °· Increased alertness or activity. °· Stretching. °· Movement of the head from side to  side. °· Movement of the head and opening of the mouth when the corner of the mouth or cheek is stroked (rooting). °· Increased sucking sounds, smacking lips, cooing, sighing, or squeaking. °· Hand-to-mouth movements. °· Increased sucking of fingers or hands. ° °Late Signs of Hunger °· Fussing. °· Intermittent crying. ° °Extreme Signs of Hunger °Signs of extreme hunger will require calming and consoling before your baby will be able to breastfeed successfully. Do not wait for the following signs of extreme hunger to occur before you initiate breastfeeding: °· Restlessness. °· A loud, strong cry. °· Screaming. ° °Breastfeeding basics °Breastfeeding Initiation °· Find a comfortable place to sit or lie down, with your neck and back well supported. °· Place a pillow or rolled up blanket under your baby to bring him or her to the level of your breast (if you are seated). Nursing pillows are specially designed to help support your arms and your baby while you breastfeed. °· Make sure that your baby's abdomen is facing your abdomen. °· Gently massage your breast. With your fingertips, massage from your chest wall toward your nipple in a circular motion. This encourages milk flow. You may need to continue this action during the feeding if your milk flows slowly. °· Support your breast with 4 fingers underneath and your thumb above your nipple. Make sure your fingers are well away from your nipple and your baby’s mouth. °· Stroke your baby's lips gently with your finger or nipple. °· When your baby's mouth is open wide enough, quickly bring your baby to your breast, placing your entire nipple and as much of the colored area   around your nipple (areola) as possible into your baby's mouth. °? More areola should be visible above your baby's upper lip than below the lower lip. °? Your baby's tongue should be between his or her lower gum and your breast. °· Ensure that your baby's mouth is correctly positioned around your nipple  (latched). Your baby's lips should create a seal on your breast and be turned out (everted). °· It is common for your baby to suck about 2-3 minutes in order to start the flow of breast milk. ° °Latching °Teaching your baby how to latch on to your breast properly is very important. An improper latch can cause nipple pain and decreased milk supply for you and poor weight gain in your baby. Also, if your baby is not latched onto your nipple properly, he or she may swallow some air during feeding. This can make your baby fussy. Burping your baby when you switch breasts during the feeding can help to get rid of the air. However, teaching your baby to latch on properly is still the best way to prevent fussiness from swallowing air while breastfeeding. °Signs that your baby has successfully latched on to your nipple: °· Silent tugging or silent sucking, without causing you pain. °· Swallowing heard between every 3-4 sucks. °· Muscle movement above and in front of his or her ears while sucking. ° °Signs that your baby has not successfully latched on to nipple: °· Sucking sounds or smacking sounds from your baby while breastfeeding. °· Nipple pain. ° °If you think your baby has not latched on correctly, slip your finger into the corner of your baby’s mouth to break the suction and place it between your baby's gums. Attempt breastfeeding initiation again. °Signs of Successful Breastfeeding °Signs from your baby: °· A gradual decrease in the number of sucks or complete cessation of sucking. °· Falling asleep. °· Relaxation of his or her body. °· Retention of a small amount of milk in his or her mouth. °· Letting go of your breast by himself or herself. ° °Signs from you: °· Breasts that have increased in firmness, weight, and size 1-3 hours after feeding. °· Breasts that are softer immediately after breastfeeding. °· Increased milk volume, as well as a change in milk consistency and color by the fifth day of  breastfeeding. °· Nipples that are not sore, cracked, or bleeding. ° °Signs That Your Baby is Getting Enough Milk °· Wetting at least 1-2 diapers during the first 24 hours after birth. °· Wetting at least 5-6 diapers every 24 hours for the first week after birth. The urine should be clear or pale yellow by 5 days after birth. °· Wetting 6-8 diapers every 24 hours as your baby continues to grow and develop. °· At least 3 stools in a 24-hour period by age 5 days. The stool should be soft and yellow. °· At least 3 stools in a 24-hour period by age 7 days. The stool should be seedy and yellow. °· No loss of weight greater than 10% of birth weight during the first 3 days of age. °· Average weight gain of 4-7 ounces (113-198 g) per week after age 4 days. °· Consistent daily weight gain by age 5 days, without weight loss after the age of 2 weeks. ° °After a feeding, your baby may spit up a small amount. This is common. °Breastfeeding frequency and duration °Frequent feeding will help you make more milk and can prevent sore nipples and breast engorgement. Breastfeed when   you feel the need to reduce the fullness of your breasts or when your baby shows signs of hunger. This is called "breastfeeding on demand." Avoid introducing a pacifier to your baby while you are working to establish breastfeeding (the first 4-6 weeks after your baby is born). After this time you may choose to use a pacifier. Research has shown that pacifier use during the first year of a baby's life decreases the risk of sudden infant death syndrome (SIDS). °Allow your baby to feed on each breast as long as he or she wants. Breastfeed until your baby is finished feeding. When your baby unlatches or falls asleep while feeding from the first breast, offer the second breast. Because newborns are often sleepy in the first few weeks of life, you may need to awaken your baby to get him or her to feed. °Breastfeeding times will vary from baby to baby. However,  the following rules can serve as a guide to help you ensure that your baby is properly fed: °· Newborns (babies 4 weeks of age or younger) may breastfeed every 1-3 hours. °· Newborns should not go longer than 3 hours during the day or 5 hours during the night without breastfeeding. °· You should breastfeed your baby a minimum of 8 times in a 24-hour period until you begin to introduce solid foods to your baby at around 6 months of age. ° °Breast milk pumping °Pumping and storing breast milk allows you to ensure that your baby is exclusively fed your breast milk, even at times when you are unable to breastfeed. This is especially important if you are going back to work while you are still breastfeeding or when you are not able to be present during feedings. Your lactation consultant can give you guidelines on how long it is safe to store breast milk. °A breast pump is a machine that allows you to pump milk from your breast into a sterile bottle. The pumped breast milk can then be stored in a refrigerator or freezer. Some breast pumps are operated by hand, while others use electricity. Ask your lactation consultant which type will work best for you. Breast pumps can be purchased, but some hospitals and breastfeeding support groups lease breast pumps on a monthly basis. A lactation consultant can teach you how to hand express breast milk, if you prefer not to use a pump. °Caring for your breasts while you breastfeed °Nipples can become dry, cracked, and sore while breastfeeding. The following recommendations can help keep your breasts moisturized and healthy: °· Avoid using soap on your nipples. °· Wear a supportive bra. Although not required, special nursing bras and tank tops are designed to allow access to your breasts for breastfeeding without taking off your entire bra or top. Avoid wearing underwire-style bras or extremely tight bras. °· Air dry your nipples for 3-4 minutes after each feeding. °· Use only cotton  bra pads to absorb leaked breast milk. Leaking of breast milk between feedings is normal. °· Use lanolin on your nipples after breastfeeding. Lanolin helps to maintain your skin's normal moisture barrier. If you use pure lanolin, you do not need to wash it off before feeding your baby again. Pure lanolin is not toxic to your baby. You may also hand express a few drops of breast milk and gently massage that milk into your nipples and allow the milk to air dry. ° °In the first few weeks after giving birth, some women experience extremely full breasts (engorgement). Engorgement can make your   breasts feel heavy, warm, and tender to the touch. Engorgement peaks within 3-5 days after you give birth. The following recommendations can help ease engorgement: °· Completely empty your breasts while breastfeeding or pumping. You may want to start by applying warm, moist heat (in the shower or with warm water-soaked hand towels) just before feeding or pumping. This increases circulation and helps the milk flow. If your baby does not completely empty your breasts while breastfeeding, pump any extra milk after he or she is finished. °· Wear a snug bra (nursing or regular) or tank top for 1-2 days to signal your body to slightly decrease milk production. °· Apply ice packs to your breasts, unless this is too uncomfortable for you. °· Make sure that your baby is latched on and positioned properly while breastfeeding. ° °If engorgement persists after 48 hours of following these recommendations, contact your health care provider or a lactation consultant. °Overall health care recommendations while breastfeeding °· Eat healthy foods. Alternate between meals and snacks, eating 3 of each per day. Because what you eat affects your breast milk, some of the foods may make your baby more irritable than usual. Avoid eating these foods if you are sure that they are negatively affecting your baby. °· Drink milk, fruit juice, and water to  satisfy your thirst (about 10 glasses a day). °· Rest often, relax, and continue to take your prenatal vitamins to prevent fatigue, stress, and anemia. °· Continue breast self-awareness checks. °· Avoid chewing and smoking tobacco. Chemicals from cigarettes that pass into breast milk and exposure to secondhand smoke may harm your baby. °· Avoid alcohol and drug use, including marijuana. °Some medicines that may be harmful to your baby can pass through breast milk. It is important to ask your health care provider before taking any medicine, including all over-the-counter and prescription medicine as well as vitamin and herbal supplements. °It is possible to become pregnant while breastfeeding. If birth control is desired, ask your health care provider about options that will be safe for your baby. °Contact a health care provider if: °· You feel like you want to stop breastfeeding or have become frustrated with breastfeeding. °· You have painful breasts or nipples. °· Your nipples are cracked or bleeding. °· Your breasts are red, tender, or warm. °· You have a swollen area on either breast. °· You have a fever or chills. °· You have nausea or vomiting. °· You have drainage other than breast milk from your nipples. °· Your breasts do not become full before feedings by the fifth day after you give birth. °· You feel sad and depressed. °· Your baby is too sleepy to eat well. °· Your baby is having trouble sleeping. °· Your baby is wetting less than 3 diapers in a 24-hour period. °· Your baby has less than 3 stools in a 24-hour period. °· Your baby's skin or the white part of his or her eyes becomes yellow. °· Your baby is not gaining weight by 5 days of age. °Get help right away if: °· Your baby is overly tired (lethargic) and does not want to wake up and feed. °· Your baby develops an unexplained fever. °This information is not intended to replace advice given to you by your health care provider. Make sure you discuss  any questions you have with your health care provider. °Document Released: 01/12/2005 Document Revised: 06/26/2015 Document Reviewed: 07/06/2012 °[employer]vier Interactive Patient Education © 2017 [employer] °Home Care Instructions for Mom °ACTIVITY °·   Gradually return to your regular activities. °· Let yourself rest. Nap while your baby sleeps. °· Avoid lifting anything that is heavier than 10 lb (4.5 kg) until your health care provider says it is okay. °· Avoid activities that take a lot of effort and energy (are strenuous) until approved by your health care provider. Walking at a slow-to-moderate pace is usually safe. °· If you had a cesarean delivery: °? Do not vacuum, climb stairs, or drive a car for 4-6 weeks. °? Have someone help you at home until you feel like you can do your usual activities yourself. °? Do exercises as told by your health care provider, if this applies. ° °VAGINAL BLEEDING °You may continue to bleed for 4-6 weeks after delivery. Over time, the amount of blood usually decreases and the color of the blood usually gets lighter. However, the flow of bright red blood may increase if you have been too active. If you need to use more than one pad in an hour because your pad gets soaked, or if you pass a large clot: °· Lie down. °· Raise your feet. °· Place a cold compress on your lower abdomen. °· Rest. °· Call your health care provider. ° °If you are breastfeeding, your period should return anytime between 8 weeks after delivery and the time that you stop breastfeeding. If you are not breastfeeding, your period should return 6-8 weeks after delivery. °PERINEAL CARE °The perineal area, or perineum, is the part of your body between your thighs. After delivery, this area needs special care. Follow these instructions as told by your health care provider. °· Take warm tub baths for 15-20 minutes. °· Use medicated pads and pain-relieving sprays and creams as told. °· Do not use tampons or douches until  vaginal bleeding has stopped. °· Each time you go to the bathroom: °? Use a peri bottle. °? Change your pad. °? Use towelettes in place of toilet paper until your stitches have healed. °· Do Kegel exercises every day. Kegel exercises help to maintain the muscles that support the vagina, bladder, and bowels. You can do these exercises while you are standing, sitting, or lying down. To do Kegel exercises: °? Tighten the muscles of your abdomen and the muscles that surround your birth canal. °? Hold for a few seconds. °? Relax. °? Repeat until you have done this 5 times in a row. °· To prevent hemorrhoids from developing or getting worse: °? Drink enough fluid to keep your urine clear or pale yellow. °? Avoid straining when having a bowel movement. °? Take over-the-counter medicines and stool softeners as told by your health care provider. ° °BREAST CARE °· Wear a tight-fitting bra. °· Avoid taking over-the-counter pain medicine for breast discomfort. °· Apply ice to the breasts to help with discomfort as needed: °? Put ice in a plastic bag. °? Place a towel between your skin and the bag. °? Leave the ice on for 20 minutes or as told by your health care provider. ° °NUTRITION °· Eat a well-balanced diet. °· Do not try to lose weight quickly by cutting back on calories. °· Take your prenatal vitamins until your postpartum checkup or until your health care provider tells you to stop. ° °POSTPARTUM DEPRESSION °You may find yourself crying for no apparent reason and unable to cope with all of the changes that come with having a newborn. This mood is called postpartum depression. Postpartum depression happens because your hormone levels change after delivery. If you have   postpartum depression, get support from your partner, friends, and family. If the depression does not go away on its own after several weeks, contact your health care provider. °BREAST SELF-EXAM °Do a breast self-exam each month, at the same time of the  month. If you are breastfeeding, check your breasts just after a feeding, when your breasts are less full. If you are breastfeeding and your period has started, check your breasts on day 5, 6, or 7 of your period. °Report any lumps, bumps, or discharge to your health care provider. Know that breasts are normally lumpy if you are breastfeeding. This is temporary, and it is not a health risk. °INTIMACY AND SEXUALITY °Avoid sexual activity for at least 3-4 weeks after delivery or until the brownish-red vaginal flow is completely gone. If you want to avoid pregnancy, use some form of birth control. You can get pregnant after delivery, even if you have not had your period. °SEEK MEDICAL CARE IF: °· You feel unable to cope with the changes that a child brings to your life, and these feelings do not go away after several weeks. °· You notice a lump, a bump, or discharge on your breast. ° °SEEK IMMEDIATE MEDICAL CARE IF: °· Blood soaks your pad in 1 hour or less. °· You have: °? Severe pain or cramping in your lower abdomen. °? A bad-smelling vaginal discharge. °? A fever that is not controlled by medicine. °? A fever, and an area of your breast is red and sore. °? Pain or redness in your calf. °? Sudden, severe chest pain. °? Shortness of breath. °? Painful or bloody urination. °? Problems with your vision. °· You vomit for 12 hours or longer. °· You develop a severe headache. °· You have serious thoughts about hurting yourself, your child, or anyone else. ° °This information is not intended to replace advice given to you by your health care provider. Make sure you discuss any questions you have with your health care provider. °Document Released: 01/10/2000 Document Revised: 06/20/2015 Document Reviewed: 07/16/2014 °Elsevier Interactive Patient Education © 2017 [employer] ° °

## 2016-06-29 NOTE — Lactation Note (Signed)
This note was copied from a baby's chart. Lactation Consultation Note  Baby 2846 hours old.  [id number]. Reviewed feeding plan with mother.  Encouraged her to continue supplementing after breastfeeding until weight stabilizes. Reviewed volume guidelines. Mother states baby is breastfeeding well on left side and would like assistance on L side. Provided mother w/ manual pump to prepump before latching on left. Mother states she has DEBP at home.  Reminded her to post pump 4-5 times a day for 10-15 min to help her milk supply. Mom has my # to call for assist w/next feeding.   Patient Name: Maria Bethanne GingerLatricia Punt ZOXWR'UToday's Date: 06/29/2016 Reason for consult: Follow-up assessment   Maternal Data    Feeding Feeding Type: Breast Fed Length of feed: 5 min  LATCH Score/Interventions                      Lactation Tools Discussed/Used     Consult Status Consult Status: Follow-up Date: 06/29/16 Follow-up type: In-patient    [name], [facility]ry Bogden [facility] 06/29/2016, 9:08 AM

## 2016-06-30 ENCOUNTER — Encounter: Payer: Medicaid Other | Admitting: Obstetrics & Gynecology

## 2016-08-05 ENCOUNTER — Ambulatory Visit: Payer: Medicaid Other | Admitting: Advanced Practice Midwife

## 2016-08-25 ENCOUNTER — Telehealth: Payer: Self-pay | Admitting: *Deleted

## 2016-08-25 NOTE — Telephone Encounter (Signed)
Called patient to try to schedule her another postpartum appointment but had to leave a voicemail.  08-25-16  AS

## 2016-12-30 DIAGNOSIS — R070 Pain in throat: Secondary | ICD-10-CM | POA: Diagnosis not present

## 2017-04-08 DIAGNOSIS — H17822 Peripheral opacity of cornea, left eye: Secondary | ICD-10-CM | POA: Diagnosis not present

## 2018-01-26 NOTE — L&D Delivery Note (Signed)
Delivery Note At 10:07 AM a viable female was delivered via Vaginal, Spontaneous (Presentation: ROA).  APGAR: 8, 9; weight  pending.   Placenta status: spontaneous, intact.  Cord: 3 vessels with the following complications: nuchal x1, body x1, leg x1.    Anesthesia:  none Episiotomy: None Lacerations:  Periurethral, hemostatic Suture Repair: n/a Est. Blood Loss (mL):  72  Mom to postpartum.  Baby to Couplet care / Skin to Skin.  Wende Mott CNM 10/22/2018, 10:19 AM

## 2018-05-02 ENCOUNTER — Telehealth: Payer: Self-pay | Admitting: Obstetrics & Gynecology

## 2018-05-02 NOTE — Telephone Encounter (Signed)
Pt calling to schedule a new OB appt. States she had a at home positive pregnancy test.

## 2018-05-05 ENCOUNTER — Encounter: Payer: Self-pay | Admitting: *Deleted

## 2018-05-09 ENCOUNTER — Encounter: Payer: Self-pay | Admitting: Adult Health

## 2018-05-09 ENCOUNTER — Other Ambulatory Visit: Payer: Self-pay

## 2018-05-09 ENCOUNTER — Ambulatory Visit (INDEPENDENT_AMBULATORY_CARE_PROVIDER_SITE_OTHER): Payer: Medicaid Other | Admitting: Adult Health

## 2018-05-09 VITALS — Ht 64.0 in | Wt 118.0 lb

## 2018-05-09 DIAGNOSIS — Z3A16 16 weeks gestation of pregnancy: Secondary | ICD-10-CM | POA: Insufficient documentation

## 2018-05-09 DIAGNOSIS — O3680X0 Pregnancy with inconclusive fetal viability, not applicable or unspecified: Secondary | ICD-10-CM

## 2018-05-09 MED ORDER — PRENATAL PLUS 27-1 MG PO TABS
1.0000 | ORAL_TABLET | Freq: Every day | ORAL | 12 refills | Status: DC
Start: 2018-05-09 — End: 2018-06-09

## 2018-05-09 NOTE — Progress Notes (Signed)
Patient ID: Maria Henderson, female   DOB: 03/25/1995, 23 y.o.   MRN: [id number]   TELEHEALTH VIRTUAL GYNECOLOGY VISIT ENCOUNTER NOTE  I connected with Maria Henderson on 05/09/18 at  2:15 PM EDT by telephone at home and verified that I am speaking with the correct person using two identifiers.   I discussed the limitations, risks, security and privacy concerns of performing an evaluation and management service by telephone and the availability of in person appointments. I also discussed with the patient that there may be a patient responsible charge related to this service. The patient expressed understanding and agreed to proceed.   History:  Maria Henderson is a 23 y.o. G55P1001 female being evaluated today for having missed a period and had +HPT, and did not want to continue then pregnancy and was seen at a clinic and told she was about 15 weeks then.By LMP would be 15+5 weeks with EDD 10/26/2018. Marland Kitchen She denies any abnormal vaginal discharge, bleeding, pelvic pain or other concerns.   Fall risk is low.       Past Medical History:  Diagnosis Date  . Medical history non-contributory    Past Surgical History:  Procedure Laterality Date  . arm surgery Right    broke arm at 23 years old  . WISDOM TOOTH EXTRACTION     The following portions of the patient's history were reviewed and updated as appropriate: allergies, current medications, past family history, past medical history, past social history, past surgical history and problem list.   Health Maintenance:  Normal pap 12/24/15.  Review of Systems:  Pertinent items noted in HPI and remainder of comprehensive ROS otherwise negative.  Physical Exam:   General:  Alert, oriented and cooperative.   Mental Status: Normal mood and affect perceived. Normal judgment and thought content.  Physical exam deferred due to nature of the encounter  Labs and Imaging No results found for this or any previous visit (from the past 336 hour(s)). No  results found.    Assessment and Plan:     1. [redacted] weeks gestation of pregnancy      Meds ordered this encounter  Medications  . prenatal vitamin w/FE, FA (PRENATAL 1 + 1) 27-1 MG TABS tablet    Sig: Take 1 tablet by mouth daily at 12 noon.    Dispense:  30 each    Refill:  12    Order Specific Question:   Supervising Provider    Answer:   Duane Lope H [[id number]]    2. Encounter to determine fetal viability of pregnancy, single or unspecified fetus  - US OB Comp + 14 Wk;will try to get in next week       I discussed the assessment and treatment plan with the patient. The patient was provided an opportunity to ask questions and all were answered. The patient agreed with the plan and demonstrated an understanding of the instructions.   The patient was advised to call back or seek an in-person evaluation/go to the ED if the symptoms worsen or if the condition fails to improve as anticipated.  I provided 10  minutes of non-face-to-face time during this encounter.   Cyril Mourning, NP [facility] [employer], [facility]

## 2018-05-13 ENCOUNTER — Other Ambulatory Visit: Payer: Self-pay | Admitting: Adult Health

## 2018-05-13 DIAGNOSIS — O3680X0 Pregnancy with inconclusive fetal viability, not applicable or unspecified: Secondary | ICD-10-CM

## 2018-05-13 DIAGNOSIS — Z363 Encounter for antenatal screening for malformations: Secondary | ICD-10-CM

## 2018-05-16 ENCOUNTER — Other Ambulatory Visit: Payer: Medicaid Other

## 2018-05-25 ENCOUNTER — Ambulatory Visit (INDEPENDENT_AMBULATORY_CARE_PROVIDER_SITE_OTHER): Payer: Medicaid Other

## 2018-05-25 ENCOUNTER — Other Ambulatory Visit: Payer: Self-pay

## 2018-05-25 DIAGNOSIS — O3680X0 Pregnancy with inconclusive fetal viability, not applicable or unspecified: Secondary | ICD-10-CM | POA: Diagnosis not present

## 2018-05-25 DIAGNOSIS — Z363 Encounter for antenatal screening for malformations: Secondary | ICD-10-CM | POA: Diagnosis not present

## 2018-05-25 DIAGNOSIS — Z3A18 18 weeks gestation of pregnancy: Secondary | ICD-10-CM | POA: Diagnosis not present

## 2018-05-25 NOTE — Progress Notes (Signed)
[place] 18+2 wks,cephalic,cervical length 2.4 cm,posterior placenta gr 0,fhr 152 bpm,svp of fluid 5 cm,normal ovaries bilat,efw 278 g 90%,anatomy complete,no obvious abnormalities EDD by LMP 10/24/2018

## 2018-06-08 ENCOUNTER — Encounter: Payer: Self-pay | Admitting: *Deleted

## 2018-06-09 ENCOUNTER — Ambulatory Visit (INDEPENDENT_AMBULATORY_CARE_PROVIDER_SITE_OTHER): Payer: Medicaid Other | Admitting: Women's Health

## 2018-06-09 ENCOUNTER — Ambulatory Visit: Payer: Medicaid Other | Admitting: *Deleted

## 2018-06-09 ENCOUNTER — Encounter: Payer: Self-pay | Admitting: Women's Health

## 2018-06-09 ENCOUNTER — Other Ambulatory Visit: Payer: Self-pay

## 2018-06-09 VITALS — BP 117/72 | HR 116 | Temp 98.5°F | Wt 115.0 lb

## 2018-06-09 DIAGNOSIS — Z3A20 20 weeks gestation of pregnancy: Secondary | ICD-10-CM

## 2018-06-09 DIAGNOSIS — Z1379 Encounter for other screening for genetic and chromosomal anomalies: Secondary | ICD-10-CM

## 2018-06-09 DIAGNOSIS — Z1389 Encounter for screening for other disorder: Secondary | ICD-10-CM

## 2018-06-09 DIAGNOSIS — Z3482 Encounter for supervision of other normal pregnancy, second trimester: Secondary | ICD-10-CM | POA: Diagnosis not present

## 2018-06-09 DIAGNOSIS — Z331 Pregnant state, incidental: Secondary | ICD-10-CM

## 2018-06-09 DIAGNOSIS — Z3490 Encounter for supervision of normal pregnancy, unspecified, unspecified trimester: Secondary | ICD-10-CM | POA: Insufficient documentation

## 2018-06-09 DIAGNOSIS — D573 Sickle-cell trait: Secondary | ICD-10-CM

## 2018-06-09 LAB — POCT URINALYSIS DIPSTICK OB
Glucose, UA: NEGATIVE
Ketones, UA: NEGATIVE
Nitrite, UA: NEGATIVE
POC,PROTEIN,UA: NEGATIVE

## 2018-06-09 MED ORDER — BLOOD PRESSURE MONITOR MISC: 0 refills | Status: DC

## 2018-06-09 NOTE — Progress Notes (Signed)
INITIAL OBSTETRICAL VISIT Patient name: Maria ALTHERR MRN [id number]  Date of birth: 1995-06-04 Chief Complaint:   Initial Prenatal Visit  History of Present Illness:   Maria Henderson is a 23 y.o. G21P1001 African American female at [redacted]w[redacted]d by LMP c/w 18wk u/s, with an Estimated Date of Delivery: 10/24/18 being seen today for her initial obstetrical visit.   Her obstetrical history is significant for term uncomplicated SVB x 1.   Today she reports no complaints.  Patient's last menstrual period was 01/17/2018 (approximate). Last pap 12/24/15. Results were: normal Review of Systems:   Pertinent items are noted in HPI Denies cramping/contractions, leakage of fluid, vaginal bleeding, abnormal vaginal discharge w/ itching/odor/irritation, headaches, visual changes, shortness of breath, chest pain, abdominal pain, severe nausea/vomiting, or problems with urination or bowel movements unless otherwise stated above.  Pertinent History Reviewed:  Reviewed past medical,surgical, social, obstetrical and family history.  Reviewed problem list, medications and allergies. OB History  Gravida Para Term Preterm AB Living  2 1 1     1   SAB TAB Ectopic Multiple Live Births        0 1    # Outcome Date GA Lbr Len/2nd Weight Sex Delivery Anes PTL Lv  2 Current           1 Term 06/27/16 [redacted]w[redacted]d 11:07 / 00:58 7 lb 2.6 oz (3.25 kg) F Vag-Spont EPI, Local N LIV     Birth Comments: none   Physical Assessment:   Vitals:   06/09/18 1403  BP: 117/72  Pulse: (!) 116  Temp: 98.5 F (36.9 C)  Weight: 115 lb (52.2 kg)  Body mass index is 19.74 kg/m.       Physical Examination:  General appearance - well appearing, and in no distress  Mental status - alert, oriented to person, place, and time  Psych:  She has a normal mood and affect  Skin - warm and dry, normal color, no suspicious lesions noted  Chest - effort normal, all lung fields clear to auscultation bilaterally  Heart - normal rate and regular  rhythm  Abdomen - soft, nontender  Extremities:  No swelling or varicosities noted  Thin prep pap is not done   Fetal Heart Rate (bpm): 158 via doppler  Results for orders placed or performed in visit on 06/09/18 (from the past 24 hour(s))  POC Urinalysis Dipstick OB   Collection Time: 06/09/18  2:04 PM  Result Value Ref Range   Color, UA     Clarity, UA     Glucose, UA Negative Negative   Bilirubin, UA     Ketones, UA neg    Spec Grav, UA     Blood, UA trace    pH, UA     POC,PROTEIN,UA Negative Negative, Trace, Small (1+), Moderate (2+), Large (3+), 4+   Urobilinogen, UA     Nitrite, UA neg    Leukocytes, UA Small (1+) (A) Negative   Appearance     Odor      Assessment & Plan:  1) Low-Risk Pregnancy G2P1001 at [redacted]w[redacted]d with an Estimated Date of Delivery: 10/24/18   2) Initial OB visit  3) Late care  Meds:  Meds ordered this encounter  Medications   Blood Pressure Monitor MISC    Sig: For regular home bp monitoring during pregnancy    Dispense:  1 each    Refill:  0    Z34.90    Initial labs obtained Continue prenatal vitamins Reviewed n/v relief  measures and warning s/s to report Reviewed recommended weight gain based on pre-gravid BMI Encouraged well-balanced diet Genetic Screening discussed: requested AFP, declined maternit21 Cystic fibrosis, SMA, Fragile X screening discussed declined Ultrasound discussed; fetal survey: results reviewed CCNC completed>PCM not here, form faxed Does not have bp cuff, [facility], check weekly, if >140/90 let us know  Follow-up: Return in about 4 weeks (around 07/07/2018) for LROB webex.   Orders Placed This Encounter  Procedures   GC/Chlamydia Probe Amp   Urine Culture   Obstetric Panel, Including HIV   Urinalysis, Routine w reflex microscopic   AFP TETRA   Pain Management Screening Profile (10S)   POC Urinalysis Dipstick OB    Cheral MarkerKimberly R Abdiaziz Klahn CNM, [facility] 06/09/2018 3:03 PM

## 2018-06-09 NOTE — Patient Instructions (Signed)
Maria Henderson, I greatly value your feedback.  If you receive a survey following your visit with [place] today, we appreciate you taking the time to fill it out.  Thanks, Joellyn Haff, CNM, [facility]  [facility] HAS MOVED!!! It is now [facility] ([address] [place], [place] [address]) Entrance located off of [address] Free 24/7 valet parking   Home Blood Pressure Monitoring for Patients   Your provider has recommended that you check your blood pressure (BP) at least once a week at home. If you do not have a blood pressure cuff at home, one will be provided for you. Contact your provider if you have not received your monitor within 1 week.   Helpful Tips for Accurate Home Blood Pressure Checks  . Don't smoke, exercise, or drink caffeine 30 minutes before checking your BP . Use the restroom before checking your BP (a full bladder can raise your pressure) . Relax in a comfortable upright chair . Feet on the ground . Left arm resting comfortably on a flat surface at the level of your heart . Legs uncrossed . Back supported . Sit quietly and don't talk . Place the cuff on your bare arm . Adjust snuggly, so that only two fingertips can fit between your skin and the top of the cuff . Check 2 readings separated by at least one minute . Keep a log of your BP readings . For a visual, please reference this diagram: http://ccnc.care/bpdiagram  Provider Name: [facility]     Phone: [phone]  Zone 1: ALL CLEAR  Continue to monitor your symptoms:  . BP reading is less than 140 (top number) or less than 90 (bottom number)  . No right upper stomach pain . No headaches or seeing spots . No feeling nauseated or throwing up . No swelling in face and hands  Zone 2: CAUTION Call your doctor's office for any of the following:  . BP reading is greater than 140 (top number) or greater than 90 (bottom number)  . Stomach pain under your ribs in the middle  or right side . Headaches or seeing spots . Feeling nauseated or throwing up . Swelling in face and hands  Zone 3: EMERGENCY  Seek immediate medical care if you have any of the following:  . BP reading is greater than160 (top number) or greater than 110 (bottom number) . Severe headaches not improving with Tylenol . Serious difficulty catching your breath . Any worsening symptoms from Zone 2      Second Trimester of Pregnancy The second trimester is from week 14 through week 27 (months 4 through 6). The second trimester is often a time when you feel your best. Your body has adjusted to being pregnant, and you begin to feel better physically. Usually, morning sickness has lessened or quit completely, you may have more energy, and you may have an increase in appetite. The second trimester is also a time when the fetus is growing rapidly. At the end of the sixth month, the fetus is about 9 inches long and weighs about 1 pounds. You will likely begin to feel the baby move (quickening) between 16 and 20 weeks of pregnancy. Body changes during your second trimester Your body continues to go through many changes during your second trimester. The changes vary from woman to woman.  Your weight will continue to increase. You will notice your lower abdomen bulging out.  You may begin to  get stretch marks on your hips, abdomen, and breasts.  You may develop headaches that can be relieved by medicines. The medicines should be approved by your health care provider.  You may urinate more often because the fetus is pressing on your bladder.  You may develop or continue to have heartburn as a result of your pregnancy.  You may develop constipation because certain hormones are causing the muscles that push waste through your intestines to slow down.  You may develop hemorrhoids or swollen, bulging veins (varicose veins).  You may have back pain. This is caused by: ? Weight gain. ? Pregnancy  hormones that are relaxing the joints in your pelvis. ? A shift in weight and the muscles that support your balance.  Your breasts will continue to grow and they will continue to become tender.  Your gums may bleed and may be sensitive to brushing and flossing.  Dark spots or blotches (chloasma, mask of pregnancy) may develop on your face. This will likely fade after the baby is born.  A dark line from your belly button to the pubic area (linea nigra) may appear. This will likely fade after the baby is born.  You may have changes in your hair. These can include thickening of your hair, rapid growth, and changes in texture. Some women also have hair loss during or after pregnancy, or hair that feels dry or thin. Your hair will most likely return to normal after your baby is born.  What to expect at prenatal visits During a routine prenatal visit:  You will be weighed to make sure you and the fetus are growing normally.  Your blood pressure will be taken.  Your abdomen will be measured to track your baby's growth.  The fetal heartbeat will be listened to.  Any test results from the previous visit will be discussed.  Your health care provider may ask you:  How you are feeling.  If you are feeling the baby move.  If you have had any abnormal symptoms, such as leaking fluid, bleeding, severe headaches, or abdominal cramping.  If you are using any tobacco products, including cigarettes, chewing tobacco, and electronic cigarettes.  If you have any questions.  Other tests that may be performed during your second trimester include:  Blood tests that check for: ? Low iron levels (anemia). ? High blood sugar that affects pregnant women (gestational diabetes) between 68 and 28 weeks. ? Rh antibodies. This is to check for a protein on red blood cells (Rh factor).  Urine tests to check for infections, diabetes, or protein in the urine.  An ultrasound to confirm the proper growth and  development of the baby.  An amniocentesis to check for possible genetic problems.  Fetal screens for spina bifida and Down syndrome.  HIV (human immunodeficiency virus) testing. Routine prenatal testing includes screening for HIV, unless you choose not to have this test.  Follow these instructions at home: Medicines  Follow your health care provider's instructions regarding medicine use. Specific medicines may be either safe or unsafe to take during pregnancy.  Take a prenatal vitamin that contains at least 600 micrograms (mcg) of folic acid.  If you develop constipation, try taking a stool softener if your health care provider approves. Eating and drinking  Eat a balanced diet that includes fresh fruits and vegetables, whole grains, good sources of protein such as meat, eggs, or tofu, and low-fat dairy. Your health care provider will help you determine the amount of weight gain  that is right for you.  Avoid raw meat and uncooked cheese. These carry germs that can cause birth defects in the baby.  If you have low calcium intake from food, talk to your health care provider about whether you should take a daily calcium supplement.  Limit foods that are high in fat and processed sugars, such as fried and sweet foods.  To prevent constipation: ? Drink enough fluid to keep your urine clear or pale yellow. ? Eat foods that are high in fiber, such as fresh fruits and vegetables, whole grains, and beans. Activity  Exercise only as directed by your health care provider. Most women can continue their usual exercise routine during pregnancy. Try to exercise for 30 minutes at least 5 days a week. Stop exercising if you experience uterine contractions.  Avoid heavy lifting, wear low heel shoes, and practice good posture.  A sexual relationship may be continued unless your health care provider directs you otherwise. Relieving pain and discomfort  Wear a good support bra to prevent discomfort  from breast tenderness.  Take warm sitz baths to soothe any pain or discomfort caused by hemorrhoids. Use hemorrhoid cream if your health care provider approves.  Rest with your legs elevated if you have leg cramps or low back pain.  If you develop varicose veins, wear support hose. Elevate your feet for 15 minutes, 3-4 times a day. Limit salt in your diet. Prenatal Care  Write down your questions. Take them to your prenatal visits.  Keep all your prenatal visits as told by your health care provider. This is important. Safety  Wear your seat belt at all times when driving.  Make a list of emergency phone numbers, including numbers for family, friends, the hospital, and police and fire departments. General instructions  Ask your health care provider for a referral to a local prenatal education class. Begin classes no later than the beginning of month 6 of your pregnancy.  Ask for help if you have counseling or nutritional needs during pregnancy. Your health care provider can offer advice or refer you to specialists for help with various needs.  Do not use hot tubs, steam rooms, or saunas.  Do not douche or use tampons or scented sanitary pads.  Do not cross your legs for long periods of time.  Avoid cat litter boxes and soil used by cats. These carry germs that can cause birth defects in the baby and possibly loss of the fetus by miscarriage or stillbirth.  Avoid all smoking, herbs, alcohol, and unprescribed drugs. Chemicals in these products can affect the formation and growth of the baby.  Do not use any products that contain nicotine or tobacco, such as cigarettes and e-cigarettes. If you need help quitting, ask your health care provider.  Visit your dentist if you have not gone yet during your pregnancy. Use a soft toothbrush to brush your teeth and be gentle when you floss. Contact a health care provider if:  You have dizziness.  You have mild pelvic cramps, pelvic  pressure, or nagging pain in the abdominal area.  You have persistent nausea, vomiting, or diarrhea.  You have a bad smelling vaginal discharge.  You have pain when you urinate. Get help right away if:  You have a fever.  You are leaking fluid from your vagina.  You have spotting or bleeding from your vagina.  You have severe abdominal cramping or pain.  You have rapid weight gain or weight loss.  You have shortness of  breath with chest pain.  You notice sudden or extreme swelling of your face, hands, ankles, feet, or legs.  You have not felt your baby move in over an hour.  You have severe headaches that do not go away when you take medicine.  You have vision changes. Summary  The second trimester is from week 14 through week 27 (months 4 through 6). It is also a time when the fetus is growing rapidly.  Your body goes through many changes during pregnancy. The changes vary from woman to woman.  Avoid all smoking, herbs, alcohol, and unprescribed drugs. These chemicals affect the formation and growth your baby.  Do not use any tobacco products, such as cigarettes, chewing tobacco, and e-cigarettes. If you need help quitting, ask your health care provider.  Contact your health care provider if you have any questions. Keep all prenatal visits as told by your health care provider. This is important. This information is not intended to replace advice given to you by your health care provider. Make sure you discuss any questions you have with your health care provider. Document Released: 01/06/2001 Document Revised: 06/20/2015 Document Reviewed: 03/15/2012 Elsevier Interactive Patient Education  2017 [place] (COVID-19) Are you at risk?  Are you at risk for the Coronavirus (COVID-19)?  To be considered HIGH RISK for Coronavirus (COVID-19), you have to meet the following criteria:  . Traveled to [place], [place], [place], [place] or [place]; or in the [place] to [place], [place], [place], or [place]; and have fever, cough, and shortness of breath within the last 2 weeks of travel OR . Been in close contact with a person diagnosed with COVID-19 within the last 2 weeks and have fever, cough, and shortness of breath . IF YOU DO NOT MEET THESE CRITERIA, YOU ARE CONSIDERED LOW RISK FOR COVID-19.  What to do if you are HIGH RISK for COVID-19?  Marland Kitchen If you are having a medical emergency, call 911. . Seek medical care right away. Before you go to a doctor's office, urgent care or emergency department, call ahead and tell them about your recent travel, contact with someone diagnosed with COVID-19, and your symptoms. You should receive instructions from your physician's office regarding next steps of care.  . When you arrive at healthcare provider, tell the healthcare staff immediately you have returned from visiting [place], [place], [place], [place] or [place]; or traveled in the [place] to [place], [place], [place], or [place]; in the last two weeks or you have been in close contact with a person diagnosed with COVID-19 in the last 2 weeks.   . Tell the health care staff about your symptoms: fever, cough and shortness of breath. . After you have been seen by a medical provider, you will be either: o Tested for (COVID-19) and discharged home on quarantine except to seek medical care if symptoms worsen, and asked to  - Stay home and avoid contact with others until you get your results (4-5 days)  - Avoid travel on public transportation if possible (such as bus, train, or airplane) or o Sent to the Emergency Department by EMS for evaluation, COVID-19 testing, and possible admission depending on your condition and test results.  What to do if you are LOW RISK for COVID-19?  Reduce your risk of any infection by using the same precautions used for avoiding the common cold or flu:  Marland Kitchen Wash your hands often with soap and warm water  for at  least 20 seconds.  If soap and water are not readily available, use an alcohol-based hand sanitizer with at least 60% alcohol.  . If coughing or sneezing, cover your mouth and nose by coughing or sneezing into the elbow areas of your shirt or coat, into a tissue or into your sleeve (not your hands). . Avoid shaking hands with others and consider head nods or verbal greetings only. . Avoid touching your eyes, nose, or mouth with unwashed hands.  . Avoid close contact with people who are sick. . Avoid places or events with large numbers of people in one location, like concerts or sporting events. . Carefully consider travel plans you have or are making. . If you are planning any travel outside or inside the [place], visit the CDC's Travelers' Health webpage for the latest health notices. . If you have some symptoms but not all symptoms, continue to monitor at home and seek medical attention if your symptoms worsen. . If you are having a medical emergency, call 911.   [place] / e-Visit: [email or web address]         [facility]: [facility]: [phone]                   [facility]: [phone]

## 2018-06-10 LAB — PMP SCREEN PROFILE (10S), URINE
Amphetamine Scrn, Ur: NEGATIVE ng/mL
BARBITURATE SCREEN URINE: NEGATIVE ng/mL
BENZODIAZEPINE SCREEN, URINE: NEGATIVE ng/mL
CANNABINOIDS UR QL SCN: NEGATIVE ng/mL
Cocaine (Metab) Scrn, Ur: NEGATIVE ng/mL
Creatinine(Crt), U: 58.7 mg/dL (ref 20.0–300.0)
Methadone Screen, Urine: NEGATIVE ng/mL
OXYCODONE+OXYMORPHONE UR QL SCN: NEGATIVE ng/mL
Opiate Scrn, Ur: NEGATIVE ng/mL
Ph of Urine: 5.8 (ref 4.5–8.9)
Phencyclidine Qn, Ur: NEGATIVE ng/mL
Propoxyphene Scrn, Ur: NEGATIVE ng/mL

## 2018-06-11 LAB — AFP TETRA
DIA Mom Value: 0.76
DIA Value (EIA): 173.98 pg/mL
DSR (By Age)    1 IN: 1048
DSR (Second Trimester) 1 IN: 9469
Gestational Age: 20 WEEKS
MSAFP Mom: 0.69
MSAFP: 50.3 ng/mL
MSHCG Mom: 0.59
MSHCG: 17497 m[IU]/mL
Maternal Age At EDD: 24.6 yr
Osb Risk: 10000
T18 (By Age): 1:4082 {titer}
Test Results:: NEGATIVE
Weight: 116 [lb_av]
uE3 Mom: 0.65
uE3 Value: 1.53 ng/mL

## 2018-06-11 LAB — OBSTETRIC PANEL, INCLUDING HIV
Antibody Screen: NEGATIVE
Basophils Absolute: 0 10*3/uL (ref 0.0–0.2)
Basos: 1 %
EOS (ABSOLUTE): 0.1 10*3/uL (ref 0.0–0.4)
Eos: 1 %
HIV Screen 4th Generation wRfx: NONREACTIVE
Hematocrit: 36.2 % (ref 34.0–46.6)
Hemoglobin: 12.4 g/dL (ref 11.1–15.9)
Hepatitis B Surface Ag: NEGATIVE
Immature Grans (Abs): 0 10*3/uL (ref 0.0–0.1)
Immature Granulocytes: 0 %
Lymphocytes Absolute: 1.1 10*3/uL (ref 0.7–3.1)
Lymphs: 14 %
MCH: 30.8 pg (ref 26.6–33.0)
MCHC: 34.3 g/dL (ref 31.5–35.7)
MCV: 90 fL (ref 79–97)
Monocytes Absolute: 0.5 10*3/uL (ref 0.1–0.9)
Monocytes: 7 %
Neutrophils Absolute: 6.4 10*3/uL (ref 1.4–7.0)
Neutrophils: 77 %
Platelets: 324 10*3/uL (ref 150–450)
RBC: 4.03 x10E6/uL (ref 3.77–5.28)
RDW: 13.9 % (ref 11.7–15.4)
RPR Ser Ql: NONREACTIVE
Rh Factor: POSITIVE
Rubella Antibodies, IGG: 10.2 index (ref >0.99)
WBC: 8.1 10*3/uL (ref 3.4–10.8)

## 2018-06-11 LAB — URINALYSIS, ROUTINE W REFLEX MICROSCOPIC
Bilirubin, UA: NEGATIVE
Glucose, UA: NEGATIVE
Ketones, UA: NEGATIVE
Nitrite, UA: NEGATIVE
Protein,UA: NEGATIVE
RBC, UA: NEGATIVE
Specific Gravity, UA: 1.014 (ref 1.005–1.030)
Urobilinogen, Ur: 0.2 mg/dL (ref 0.2–1.0)
pH, UA: 6 (ref 5.0–7.5)

## 2018-06-11 LAB — MICROSCOPIC EXAMINATION: Casts: NONE SEEN /lpf

## 2018-06-11 LAB — URINE CULTURE

## 2018-06-14 LAB — GC/CHLAMYDIA PROBE AMP
Chlamydia trachomatis, NAA: NEGATIVE
Neisseria Gonorrhoeae by PCR: NEGATIVE

## 2018-06-23 DIAGNOSIS — Z3490 Encounter for supervision of normal pregnancy, unspecified, unspecified trimester: Secondary | ICD-10-CM | POA: Diagnosis not present

## 2018-07-08 ENCOUNTER — Encounter: Payer: Self-pay | Admitting: Obstetrics & Gynecology

## 2018-07-08 ENCOUNTER — Ambulatory Visit (INDEPENDENT_AMBULATORY_CARE_PROVIDER_SITE_OTHER): Payer: Medicaid Other | Admitting: Obstetrics & Gynecology

## 2018-07-08 ENCOUNTER — Other Ambulatory Visit: Payer: Self-pay

## 2018-07-08 VITALS — BP 109/65 | HR 102 | Wt 118.0 lb

## 2018-07-08 DIAGNOSIS — Z3482 Encounter for supervision of other normal pregnancy, second trimester: Secondary | ICD-10-CM

## 2018-07-08 DIAGNOSIS — Z3A24 24 weeks gestation of pregnancy: Secondary | ICD-10-CM

## 2018-07-08 NOTE — Progress Notes (Signed)
   [facility] VIRTUAL OBSTETRICS VISIT ENCOUNTER NOTE  I connected with Maria Henderson on 07/08/18 at  9:00 AM EDT by telephone at home and verified that I am speaking with the correct person using two identifiers.   I discussed the limitations, risks, security and privacy concerns of performing an evaluation and management service by telephone and the availability of in person appointments. I also discussed with the patient that there may be a patient responsible charge related to this service. The patient expressed understanding and agreed to proceed.  Subjective:  Maria Henderson is a 23 y.o. G2P1001 at [redacted]w[redacted]d being followed for ongoing prenatal care.  She is currently monitored for the following issues for this low-risk pregnancy and has Sickle cell trait ([place]) and Supervision of normal pregnancy on their problem list.  Patient reports no complaints. Reports fetal movement. Denies any contractions, bleeding or leaking of fluid.   The following portions of the patient's history were reviewed and updated as appropriate: allergies, current medications, past family history, past medical history, past social history, past surgical history and problem list.   Objective:   General:  Alert, oriented and cooperative.   Mental Status: Normal mood and affect perceived. Normal judgment and thought content.  Rest of physical exam deferred due to type of encounter  Assessment and Plan:  Pregnancy: G2P1001 at [redacted]w[redacted]d There are no diagnoses linked to this encounter. Preterm labor symptoms and general obstetric precautions including but not limited to vaginal bleeding, contractions, leaking of fluid and fetal movement were reviewed in detail with the patient.  I discussed the assessment and treatment plan with the patient. The patient was provided an opportunity to ask questions and all were answered. The patient agreed with the plan and demonstrated an understanding of the instructions. The patient was  advised to call back or seek an in-person office evaluation/go to [facility] at [facility] for any urgent or concerning symptoms. Please refer to After Visit Summary for other counseling recommendations.   I provided 15 minutes of non-face-to-face time during this encounter.  Return in about 4 weeks (around 08/05/2018) for PN2, LROB.  No future appointments.  Florian Buff, MD [facility] [employer], [place]

## 2018-08-05 ENCOUNTER — Encounter: Payer: Self-pay | Admitting: Women's Health

## 2018-08-05 ENCOUNTER — Other Ambulatory Visit: Payer: Medicaid Other

## 2018-08-05 ENCOUNTER — Other Ambulatory Visit: Payer: Self-pay

## 2018-08-05 ENCOUNTER — Ambulatory Visit (INDEPENDENT_AMBULATORY_CARE_PROVIDER_SITE_OTHER): Payer: Medicaid Other | Admitting: Women's Health

## 2018-08-05 VITALS — BP 107/48 | HR 97 | Wt 123.4 lb

## 2018-08-05 DIAGNOSIS — Z3A28 28 weeks gestation of pregnancy: Secondary | ICD-10-CM

## 2018-08-05 DIAGNOSIS — Z331 Pregnant state, incidental: Secondary | ICD-10-CM

## 2018-08-05 DIAGNOSIS — Z3480 Encounter for supervision of other normal pregnancy, unspecified trimester: Secondary | ICD-10-CM

## 2018-08-05 DIAGNOSIS — D573 Sickle-cell trait: Secondary | ICD-10-CM | POA: Diagnosis not present

## 2018-08-05 DIAGNOSIS — O0993 Supervision of high risk pregnancy, unspecified, third trimester: Secondary | ICD-10-CM

## 2018-08-05 DIAGNOSIS — Z1389 Encounter for screening for other disorder: Secondary | ICD-10-CM

## 2018-08-05 DIAGNOSIS — Z3482 Encounter for supervision of other normal pregnancy, second trimester: Secondary | ICD-10-CM

## 2018-08-05 DIAGNOSIS — O99013 Anemia complicating pregnancy, third trimester: Secondary | ICD-10-CM

## 2018-08-05 LAB — POCT URINALYSIS DIPSTICK OB
Blood, UA: NEGATIVE
Glucose, UA: NEGATIVE
Ketones, UA: NEGATIVE
Leukocytes, UA: NEGATIVE
Nitrite, UA: NEGATIVE
POC,PROTEIN,UA: NEGATIVE

## 2018-08-05 NOTE — Progress Notes (Signed)
   LOW-RISK PREGNANCY VISIT Patient name: Maria Henderson MRN [id number]  Date of birth: 1995-03-19 Chief Complaint:   Routine Prenatal Visit  History of Present Illness:   Maria Henderson is a 23 y.o. G31P1001 female at [redacted]w[redacted]d with an Estimated Date of Delivery: 10/24/18 being seen today for ongoing management of a low-risk pregnancy.  Today she reports no complaints. Contractions: Not present. Vag. Bleeding: None.  Movement: Present. denies leaking of fluid. Review of Systems:   Pertinent items are noted in HPI Denies abnormal vaginal discharge w/ itching/odor/irritation, headaches, visual changes, shortness of breath, chest pain, abdominal pain, severe nausea/vomiting, or problems with urination or bowel movements unless otherwise stated above. Pertinent History Reviewed:  Reviewed past medical,surgical, social, obstetrical and family history.  Reviewed problem list, medications and allergies. Physical Assessment:   Vitals:   08/05/18 0915  BP: (!) 107/48  Pulse: 97  Weight: 123 lb 6.4 oz (56 kg)  Body mass index is 21.18 kg/m.        Physical Examination:   General appearance: Well appearing, and in no distress  Mental status: Alert, oriented to person, place, and time  Skin: Warm & dry  Cardiovascular: Normal heart rate noted  Respiratory: Normal respiratory effort, no distress  Abdomen: Soft, gravid, nontender  Pelvic: Cervical exam deferred         Extremities: Edema: Trace  Fetal Status: Fetal Heart Rate (bpm): 138 Fundal Height: 25 cm Movement: Present    Results for orders placed or performed in visit on 08/05/18 (from the past 24 hour(s))  POC Urinalysis Dipstick OB   Collection Time: 08/05/18  9:18 AM  Result Value Ref Range   Color, UA     Clarity, UA     Glucose, UA Negative Negative   Bilirubin, UA     Ketones, UA neg    Spec Grav, UA     Blood, UA neg    pH, UA     POC,PROTEIN,UA Negative Negative, Trace, Small (1+), Moderate (2+), Large (3+), 4+   Urobilinogen, UA     Nitrite, UA neg    Leukocytes, UA Negative Negative   Appearance     Odor      Assessment & Plan:  1) Low-risk pregnancy G2P1001 at [redacted]w[redacted]d with an Estimated Date of Delivery: 10/24/18   2) EFW 90% @ 18wks  3) +SCT> urine cx today   Meds: No orders of the defined types were placed in this encounter.  Labs/procedures today: pn2, declined tdap today, maybe next visit  Plan:  Continue routine obstetrical care  Reviewed: Preterm labor symptoms and general obstetric precautions including but not limited to vaginal bleeding, contractions, leaking of fluid and fetal movement were reviewed in detail with the patient.  All questions were answered. Has home bp cuff. Check bp weekly, let us know if >140/90.   Follow-up: Return in about 4 weeks (around 09/02/2018) for LROB, [facility].  Orders Placed This Encounter  Procedures  . Urine Culture  . POC Urinalysis Dipstick OB   Roma Schanz CNM, [facility] 08/05/2018 9:44 AM

## 2018-08-05 NOTE — Patient Instructions (Signed)
Maria Henderson, I greatly value your feedback.  If you receive a survey following your visit with us today, we appreciate you taking the time to fill it out.  Thanks, Maria Henderson, CNM, [facility]  [facility] now [facility] ([address] [place], [place] [address]) Entrance located off of [address] Free 24/7 valet parking    Call the office [phone]) or go to [facility] if:  You begin to have strong, frequent contractions  Your water breaks.  Sometimes it is a big gush of fluid, sometimes it is just a trickle that keeps getting your panties wet or running down your legs  You have vaginal bleeding.  It is normal to have a small amount of spotting if your cervix was checked.   You don't feel your baby moving like normal.  If you don't, get you something to eat and drink and lay down and focus on feeling your baby move.  You should feel at least 10 movements in 2 hours.  If you don't, you should call the office or go to [facility].    Tdap Vaccine  It is recommended that you get the Tdap vaccine during the third trimester of EACH pregnancy to help protect your baby from getting pertussis (whooping cough)  27-36 weeks is the BEST time to do this so that you can pass the protection on to your baby. During pregnancy is better than after pregnancy, but if you are unable to get it during pregnancy it will be offered at the hospital.   You can get this vaccine with us, at the health department, your family doctor, or some local pharmacies  Everyone who will be around your baby should also be up-to-date on their vaccines before the baby comes. Adults (who are not pregnant) only need 1 dose of Tdap during adulthood.   Home Blood Pressure Monitoring for Patients   Your provider has recommended that you check your blood pressure (BP) at least once a week at home. If you do not have a blood pressure cuff at home, one will be  provided for you. Contact your provider if you have not received your monitor within 1 week.   Helpful Tips for Accurate Home Blood Pressure Checks  . Don't smoke, exercise, or drink caffeine 30 minutes before checking your BP . Use the restroom before checking your BP (a full bladder can raise your pressure) . Relax in a comfortable upright chair . Feet on the ground . Left arm resting comfortably on a flat surface at the level of your heart . Legs uncrossed . Back supported . Sit quietly and don't talk . Place the cuff on your bare arm . Adjust snuggly, so that only two fingertips can fit between your skin and the top of the cuff . Check 2 readings separated by at least one minute . Keep a log of your BP readings . For a visual, please reference this diagram: http://ccnc.care/bpdiagram  Provider Name: [facility]     Phone: [phone]  Zone 1: ALL CLEAR  Continue to monitor your symptoms:  . BP reading is less than 140 (top number) or less than 90 (bottom number)  . No right upper stomach pain . No headaches or seeing spots . No feeling nauseated or throwing up . No swelling in face and hands  Zone 2: CAUTION Call your doctor's office for any of the following:  . BP reading  is greater than 140 (top number) or greater than 90 (bottom number)  . Stomach pain under your ribs in the middle or right side . Headaches or seeing spots . Feeling nauseated or throwing up . Swelling in face and hands  Zone 3: EMERGENCY  Seek immediate medical care if you have any of the following:  . BP reading is greater than160 (top number) or greater than 110 (bottom number) . Severe headaches not improving with Tylenol . Serious difficulty catching your breath . Any worsening symptoms from Zone 2   Third Trimester of Pregnancy The third trimester is from week 29 through week 42, months 7 through 9. The third trimester is a time when the fetus is growing rapidly. At the end of the ninth  month, the fetus is about 20 inches in length and weighs 6-10 pounds.  BODY CHANGES Your body goes through many changes during pregnancy. The changes vary from woman to woman.   Your weight will continue to increase. You can expect to gain 25-35 pounds (11-16 kg) by the end of the pregnancy.  You may begin to get stretch marks on your hips, abdomen, and breasts.  You may urinate more often because the fetus is moving lower into your pelvis and pressing on your bladder.  You may develop or continue to have heartburn as a result of your pregnancy.  You may develop constipation because certain hormones are causing the muscles that push waste through your intestines to slow down.  You may develop hemorrhoids or swollen, bulging veins (varicose veins).  You may have pelvic pain because of the weight gain and pregnancy hormones relaxing your joints between the bones in your pelvis. Backaches may result from overexertion of the muscles supporting your posture.  You may have changes in your hair. These can include thickening of your hair, rapid growth, and changes in texture. Some women also have hair loss during or after pregnancy, or hair that feels dry or thin. Your hair will most likely return to normal after your baby is born.  Your breasts will continue to grow and be tender. A yellow discharge may leak from your breasts called colostrum.  Your belly button may stick out.  You may feel short of breath because of your expanding uterus.  You may notice the fetus "dropping," or moving lower in your abdomen.  You may have a bloody mucus discharge. This usually occurs a few days to a week before labor begins.  Your cervix becomes thin and soft (effaced) near your due date. WHAT TO EXPECT AT YOUR PRENATAL EXAMS  You will have prenatal exams every 2 weeks until week 36. Then, you will have weekly prenatal exams. During a routine prenatal visit:  You will be weighed to make sure you and the  fetus are growing normally.  Your blood pressure is taken.  Your abdomen will be measured to track your baby's growth.  The fetal heartbeat will be listened to.  Any test results from the previous visit will be discussed.  You may have a cervical check near your due date to see if you have effaced. At around 36 weeks, your caregiver will check your cervix. At the same time, your caregiver will also perform a test on the secretions of the vaginal tissue. This test is to determine if a type of bacteria, Group B streptococcus, is present. Your caregiver will explain this further. Your caregiver may ask you:  What your birth plan is.  How you are  feeling.  If you are feeling the baby move.  If you have had any abnormal symptoms, such as leaking fluid, bleeding, severe headaches, or abdominal cramping.  If you have any questions. Other tests or screenings that may be performed during your third trimester include:  Blood tests that check for low iron levels (anemia).  Fetal testing to check the health, activity level, and growth of the fetus. Testing is done if you have certain medical conditions or if there are problems during the pregnancy. FALSE LABOR You may feel small, irregular contractions that eventually go away. These are called Braxton Hicks contractions, or false labor. Contractions may last for hours, days, or even weeks before true labor sets in. If contractions come at regular intervals, intensify, or become painful, it is best to be seen by your caregiver.  SIGNS OF LABOR   Menstrual-like cramps.  Contractions that are 5 minutes apart or less.  Contractions that start on the top of the uterus and spread down to the lower abdomen and back.  A sense of increased pelvic pressure or back pain.  A watery or bloody mucus discharge that comes from the vagina. If you have any of these signs before the 37th week of pregnancy, call your caregiver right away. You need to go to  the hospital to get checked immediately. HOME CARE INSTRUCTIONS   Avoid all smoking, herbs, alcohol, and unprescribed drugs. These chemicals affect the formation and growth of the baby.  Follow your caregiver's instructions regarding medicine use. There are medicines that are either safe or unsafe to take during pregnancy.  Exercise only as directed by your caregiver. Experiencing uterine cramps is a good sign to stop exercising.  Continue to eat regular, healthy meals.  Wear a good support bra for breast tenderness.  Do not use hot tubs, steam rooms, or saunas.  Wear your seat belt at all times when driving.  Avoid raw meat, uncooked cheese, cat litter boxes, and soil used by cats. These carry germs that can cause birth defects in the baby.  Take your prenatal vitamins.  Try taking a stool softener (if your caregiver approves) if you develop constipation. Eat more high-fiber foods, such as fresh vegetables or fruit and whole grains. Drink plenty of fluids to keep your urine clear or pale yellow.  Take warm sitz baths to soothe any pain or discomfort caused by hemorrhoids. Use hemorrhoid cream if your caregiver approves.  If you develop varicose veins, wear support hose. Elevate your feet for 15 minutes, 3-4 times a day. Limit salt in your diet.  Avoid heavy lifting, wear low heal shoes, and practice good posture.  Rest a lot with your legs elevated if you have leg cramps or low back pain.  Visit your dentist if you have not gone during your pregnancy. Use a soft toothbrush to brush your teeth and be gentle when you floss.  A sexual relationship may be continued unless your caregiver directs you otherwise.  Do not travel far distances unless it is absolutely necessary and only with the approval of your caregiver.  Take prenatal classes to understand, practice, and ask questions about the labor and delivery.  Make a trial run to the hospital.  Pack your hospital  bag.  Prepare the baby's nursery.  Continue to go to all your prenatal visits as directed by your caregiver. SEEK MEDICAL CARE IF:  You are unsure if you are in labor or if your water has broken.  You have dizziness.  You have mild pelvic cramps, pelvic pressure, or nagging pain in your abdominal area.  You have persistent nausea, vomiting, or diarrhea.  You have a bad smelling vaginal discharge.  You have pain with urination. SEEK IMMEDIATE MEDICAL CARE IF:   You have a fever.  You are leaking fluid from your vagina.  You have spotting or bleeding from your vagina.  You have severe abdominal cramping or pain.  You have rapid weight loss or gain.  You have shortness of breath with chest pain.  You notice sudden or extreme swelling of your face, hands, ankles, feet, or legs.  You have not felt your baby move in over an hour.  You have severe headaches that do not go away with medicine.  You have vision changes. Document Released: 01/06/2001 Document Revised: 01/17/2013 Document Reviewed: 03/15/2012 [facility] Patient Information 2015 [place], [place]. This information is not intended to replace advice given to you by your health care provider. Make sure you discuss any questions you have with your health care provider.

## 2018-08-06 LAB — CBC
Hematocrit: 32.3 % — ABNORMAL LOW (ref 34.0–46.6)
Hemoglobin: 11.7 g/dL (ref 11.1–15.9)
MCH: 31.2 pg (ref 26.6–33.0)
MCHC: 36.2 g/dL — ABNORMAL HIGH (ref 31.5–35.7)
MCV: 86 fL (ref 79–97)
Platelets: 270 10*3/uL (ref 150–450)
RBC: 3.75 x10E6/uL — ABNORMAL LOW (ref 3.77–5.28)
RDW: 13 % (ref 11.7–15.4)
WBC: 6.8 10*3/uL (ref 3.4–10.8)

## 2018-08-06 LAB — GLUCOSE TOLERANCE, 2 HOURS W/ 1HR
Glucose, 1 hour: 140 mg/dL (ref 65–179)
Glucose, 2 hour: 122 mg/dL (ref 65–152)
Glucose, Fasting: 75 mg/dL (ref 65–91)

## 2018-08-06 LAB — ANTIBODY SCREEN: Antibody Screen: NEGATIVE

## 2018-08-06 LAB — HIV ANTIBODY (ROUTINE TESTING W REFLEX): HIV Screen 4th Generation wRfx: NONREACTIVE

## 2018-08-06 LAB — RPR: RPR Ser Ql: NONREACTIVE

## 2018-08-07 LAB — URINE CULTURE

## 2018-09-02 ENCOUNTER — Encounter: Payer: Self-pay | Admitting: *Deleted

## 2018-09-02 ENCOUNTER — Telehealth: Payer: Medicaid Other | Admitting: Women's Health

## 2018-09-20 ENCOUNTER — Other Ambulatory Visit: Payer: Self-pay

## 2018-09-20 ENCOUNTER — Telehealth (INDEPENDENT_AMBULATORY_CARE_PROVIDER_SITE_OTHER): Payer: Medicaid Other | Admitting: Women's Health

## 2018-09-20 ENCOUNTER — Encounter: Payer: Self-pay | Admitting: Women's Health

## 2018-09-20 VITALS — BP 123/72 | Ht 64.0 in

## 2018-09-20 DIAGNOSIS — Z803 Family history of malignant neoplasm of breast: Secondary | ICD-10-CM

## 2018-09-20 DIAGNOSIS — Z3483 Encounter for supervision of other normal pregnancy, third trimester: Secondary | ICD-10-CM

## 2018-09-20 DIAGNOSIS — Z3A35 35 weeks gestation of pregnancy: Secondary | ICD-10-CM

## 2018-09-20 NOTE — Patient Instructions (Signed)
Maria Henderson, I greatly value your feedback.  If you receive a survey following your visit with [place] today, we appreciate you taking the time to fill it out.  Thanks, Maria Henderson, CNM, [facility]  [name]!!! It is now [name] at [facility] ([place], [place] [address]) Entrance located off of [address] parking   Go to [email or web address] to register for FREE online childbirth classes    Call the office [phone]) or go to [name] if:  You begin to have strong, frequent contractions  Your water breaks.  Sometimes it is a big gush of fluid, sometimes it is just a trickle that keeps getting your panties wet or running down your legs  You have vaginal bleeding.  It is normal to have a small amount of spotting if your cervix was checked.   You don't feel your baby moving like normal.  If you don't, get you something to eat and drink and lay down and focus on feeling your baby move.  You should feel at least 10 movements in 2 hours.  If you don't, you should call the office or go to [place] Blood Pressure Monitoring for Patients   Your provider has recommended that you check your blood pressure (BP) at least once a week at home. If you do not have a blood pressure cuff at home, one will be provided for you. Contact your provider if you have not received your monitor within 1 week.   Helpful Tips for Accurate Home Blood Pressure Checks  . Don't smoke, exercise, or drink caffeine 30 minutes before checking your BP . Use the restroom before checking your BP (a full bladder can raise your pressure) . Relax in a comfortable upright chair . Feet on the ground . Left arm resting comfortably on a flat surface at the level of your heart . Legs uncrossed . Back supported . Sit quietly and don't talk . Place the cuff on your bare arm . Adjust snuggly, so that only two fingertips can fit between your skin  and the top of the cuff . Check 2 readings separated by at least one minute . Keep a log of your BP readings . For a visual, please reference this diagram: http://ccnc.care/bpdiagram  Provider Name: [facility]     Phone: [phone]  Zone 1: ALL CLEAR  Continue to monitor your symptoms:  . BP reading is less than 140 (top number) or less than 90 (bottom number)  . No right upper stomach pain . No headaches or seeing spots . No feeling nauseated or throwing up . No swelling in face and hands  Zone 2: CAUTION Call your doctor's office for any of the following:  . BP reading is greater than 140 (top number) or greater than 90 (bottom number)  . Stomach pain under your ribs in the middle or right side . Headaches or seeing spots . Feeling nauseated or throwing up . Swelling in face and hands  Zone 3: EMERGENCY  Seek immediate medical care if you have any of the following:  . BP reading is greater than160 (top number) or greater than 110 (bottom number) . Severe headaches not improving with Tylenol . Serious difficulty catching your breath . Any worsening symptoms from Zone 2   Preterm Labor and Birth Information  The normal length of a pregnancy is 39-41 weeks. Preterm labor is when labor starts before  37 completed weeks of pregnancy. What are the risk factors for preterm labor? Preterm labor is more likely to occur in women who:  Have certain infections during pregnancy such as a bladder infection, sexually transmitted infection, or infection inside the uterus (chorioamnionitis).  Have a shorter-than-normal cervix.  Have gone into preterm labor before.  Have had surgery on their cervix.  Are younger than age 10 or older than age 73.  Are African American.  Are pregnant with twins or multiple babies (multiple gestation).  Take street drugs or smoke while pregnant.  Do not gain enough weight while pregnant.  Became pregnant shortly after having been pregnant.  What are the symptoms of preterm labor? Symptoms of preterm labor include:  Cramps similar to those that can happen during a menstrual period. The cramps may happen with diarrhea.  Pain in the abdomen or lower back.  Regular uterine contractions that may feel like tightening of the abdomen.  A feeling of increased pressure in the pelvis.  Increased watery or bloody mucus discharge from the vagina.  Water breaking (ruptured amniotic sac). Why is it important to recognize signs of preterm labor? It is important to recognize signs of preterm labor because babies who are born prematurely may not be fully developed. This can put them at an increased risk for:  Long-term (chronic) heart and lung problems.  Difficulty immediately after birth with regulating body systems, including blood sugar, body temperature, heart rate, and breathing rate.  Bleeding in the brain.  Cerebral palsy.  Learning difficulties.  Death. These risks are highest for babies who are born before 32 weeks of pregnancy. How is preterm labor treated? Treatment depends on the length of your pregnancy, your condition, and the health of your baby. It may involve:  Having a stitch (suture) placed in your cervix to prevent your cervix from opening too early (cerclage).  Taking or being given medicines, such as: ? Hormone medicines. These may be given early in pregnancy to help support the pregnancy. ? Medicine to stop contractions. ? Medicines to help mature the baby's lungs. These may be prescribed if the risk of delivery is high. ? Medicines to prevent your baby from developing cerebral palsy. If the labor happens before 34 weeks of pregnancy, you may need to stay in the hospital. What should I do if I think I am in preterm labor? If you think that you are going into preterm labor, call your health care provider right away. How can I prevent preterm labor in future pregnancies? To increase your chance of having a  full-term pregnancy:  Do not use any tobacco products, such as cigarettes, chewing tobacco, and e-cigarettes. If you need help quitting, ask your health care provider.  Do not use street drugs or medicines that have not been prescribed to you during your pregnancy.  Talk with your health care provider before taking any herbal supplements, even if you have been taking them regularly.  Make sure you gain a healthy amount of weight during your pregnancy.  Watch for infection. If you think that you might have an infection, get it checked right away.  Make sure to tell your health care provider if you have gone into preterm labor before. This information is not intended to replace advice given to you by your health care provider. Make sure you discuss any questions you have with your health care provider. Document Released: 04/04/2003 Document Revised: 05/06/2018 Document Reviewed: 06/05/2015 Elsevier Patient Education  2020 [employer].

## 2018-09-20 NOTE — Progress Notes (Signed)
   TELEHEALTH VIRTUAL OBSTETRICS VISIT ENCOUNTER NOTE Patient name: Maria Henderson MRN [id number]  Date of birth: 1995/05/01  I connected with patient on 09/20/18 at 10:30 AM EDT by webex and verified that I am speaking with the correct person using two identifiers. Due to COVID-19 recommendations, pt is not currently in our office.    I discussed the limitations, risks, security and privacy concerns of performing an evaluation and management service by telephone and the availability of in person appointments. I also discussed with the patient that there may be a patient responsible charge related to this service. The patient expressed understanding and agreed to proceed.  Chief Complaint:   Routine Prenatal Visit  History of Present Illness:   Maria Henderson is a 23 y.o. G94P1001 female at [redacted]w[redacted]d with an Estimated Date of Delivery: 10/24/18 being evaluated today for ongoing management of a low-risk pregnancy.  Today she reports no complaints.  .  .   . denies leaking of fluid. Review of Systems:   Pertinent items are noted in HPI Denies abnormal vaginal discharge w/ itching/odor/irritation, headaches, visual changes, shortness of breath, chest pain, abdominal pain, severe nausea/vomiting, or problems with urination or bowel movements unless otherwise stated above. Pertinent History Reviewed:  Reviewed past medical,surgical, social, obstetrical and family history.  Reviewed problem list, medications and allergies. Physical Assessment:   Vitals:   09/20/18 1028  BP: 123/72  Height: 5\' 4"  (1.626 m)  Body mass index is 21.18 kg/m.        Physical Examination:   General:  Alert, oriented and cooperative.   Mental Status: Normal mood and affect perceived. Normal judgment and thought content.  Rest of physical exam deferred due to type of encounter  No results found for this or any previous visit (from the past 24 hour(s)).  Assessment & Plan:  1) Pregnancy G2P1001 at [redacted]w[redacted]d with an  Estimated Date of Delivery: 10/24/18    Meds: No orders of the defined types were placed in this encounter.  Labs/procedures today: none  Plan:  Continue routine obstetrical care.  Has home bp cuff.  Check bp weekly, let us know if >140/90.   Reviewed: Preterm labor symptoms and general obstetric precautions including but not limited to vaginal bleeding, contractions, leaking of fluid and fetal movement were reviewed in detail with the patient. The patient was advised to call back or seek an in-person office evaluation/go to MAU at [facility] for any urgent or concerning symptoms. All questions were answered. Please refer to After Visit Summary for other counseling recommendations.    I provided 15 minutes of non-face-to-face time during this encounter.  Follow-up: Return in about 1 week (around 09/27/2018) for [place], in person.  No orders of the defined types were placed in this encounter.  [place], [facility] 09/20/2018 10:37 AM

## 2018-09-28 ENCOUNTER — Other Ambulatory Visit: Payer: Self-pay

## 2018-09-28 ENCOUNTER — Ambulatory Visit (INDEPENDENT_AMBULATORY_CARE_PROVIDER_SITE_OTHER): Payer: Medicaid Other | Admitting: Obstetrics and Gynecology

## 2018-09-28 VITALS — BP 100/57 | HR 90 | Wt 128.0 lb

## 2018-09-28 DIAGNOSIS — Z3483 Encounter for supervision of other normal pregnancy, third trimester: Secondary | ICD-10-CM | POA: Diagnosis not present

## 2018-09-28 DIAGNOSIS — Z3A36 36 weeks gestation of pregnancy: Secondary | ICD-10-CM | POA: Diagnosis not present

## 2018-09-28 NOTE — Progress Notes (Signed)
Patient ID: Maria Henderson, female   DOB: [birth year], 23 y.o.   MRN: [id number]    LOW-RISK PREGNANCY VISIT Patient name: Maria Henderson MRN [id number]  Date of birth: 05-21-1995 Chief Complaint:   Routine Prenatal Visit  History of Present Illness:   Maria Henderson is a 23 y.o. G70P1001 female at [redacted]w[redacted]d with an Estimated Date of Delivery: 10/24/18 being seen today for ongoing management of a low-risk pregnancy.  Today she reports no complaints. Contractions: Not present. Vag. Bleeding: None.  Movement: Present. denies leaking of fluid. Review of Systems:   Pertinent items are noted in HPI Denies abnormal vaginal discharge w/ itching/odor/irritation, headaches, visual changes, shortness of breath, chest pain, abdominal pain, severe nausea/vomiting, or problems with urination or bowel movements unless otherwise stated above. Pertinent History Reviewed:  Reviewed past medical,surgical, social, obstetrical and family history.  Reviewed problem list, medications and allergies. Physical Assessment:   Vitals:   09/28/18 1131  BP: (!) 100/57  Pulse: 90  Weight: 128 lb (58.1 kg)  Body mass index is 21.97 kg/m.        Physical Examination:   General appearance: Well appearing, and in no distress  Mental status: Alert, oriented to person, place, and time  Skin: Warm & dry  Cardiovascular: Normal heart rate noted  Respiratory: Normal respiratory effort, no distress  Abdomen: Soft, gravid, nontender  Pelvic: Cervical exam performed closed, 50% thinned, 0 station        Extremities: Edema: Trace  Fetal Status: Fetal Heart Rate (bpm): 131 Fundal Height: 34 cm Movement: Present    No results found for this or any previous visit (from the past 24 hour(s)).  Assessment & Plan:  1) Low-risk pregnancy G2P1001 at [redacted]w[redacted]d with an Estimated Date of Delivery: 10/24/18    Meds: No orders of the defined types were placed in this encounter.  Labs/procedures today: GBS/GCCHL  Plan:  Continue routine  obstetrical care   Reviewed: Preterm labor symptoms and general obstetric precautions including but not limited to vaginal bleeding, contractions, leaking of fluid and fetal movement were reviewed in detail with the patient.  All questions were answered.   Follow-up: Return in about 1 week (around 10/05/2018) for Televisit.  Orders Placed This Encounter  Procedures  . GC/Chlamydia Probe Amp  . Strep Gp B NAA+Rflx   By signing my name below, I, Samul Dada, attest that this documentation has been prepared under the direction and in the presence of Jonnie Kind, MD. Electronically Signed: [place]. 09/28/18. 11:49 AM.  I personally performed the services described in this documentation, which was SCRIBED in my presence. The recorded information has been reviewed and considered accurate. It has been edited as necessary during review. Jonnie Kind, MD

## 2018-09-30 LAB — STREP GP B NAA+RFLX: Strep Gp B NAA+Rflx: NEGATIVE

## 2018-10-02 LAB — GC/CHLAMYDIA PROBE AMP
Chlamydia trachomatis, NAA: NEGATIVE
Neisseria Gonorrhoeae by PCR: NEGATIVE

## 2018-10-05 ENCOUNTER — Encounter: Payer: Self-pay | Admitting: Obstetrics and Gynecology

## 2018-10-05 ENCOUNTER — Telehealth (INDEPENDENT_AMBULATORY_CARE_PROVIDER_SITE_OTHER): Payer: Medicaid Other | Admitting: Obstetrics and Gynecology

## 2018-10-05 ENCOUNTER — Other Ambulatory Visit: Payer: Self-pay

## 2018-10-05 VITALS — BP 101/62 | HR 61

## 2018-10-05 DIAGNOSIS — Z3A37 37 weeks gestation of pregnancy: Secondary | ICD-10-CM

## 2018-10-05 DIAGNOSIS — Z3483 Encounter for supervision of other normal pregnancy, third trimester: Secondary | ICD-10-CM

## 2018-10-05 NOTE — Patient Instructions (Signed)

## 2018-10-05 NOTE — Progress Notes (Signed)
WEBEX VIDEO VIRTUAL OBSTETRICS VISIT ENCOUNTER NOTE  I connected with Charonda L Mees on 10/05/18 at  8:50 AM EDT by WebEx video at home and verified that I am speaking with the correct person using two identifiers.   I discussed the limitations, risks, security and privacy concerns of performing an evaluation and management service by WebEx video and the availability of in person appointments. I also discussed with the patient that there may be a patient responsible charge related to this service. The patient expressed understanding and agreed to proceed.  Subjective:  Maria Henderson is a 23 y.o. G2P1001 at [redacted]w[redacted]d being followed for ongoing prenatal care.  She is currently monitored for the following issues for this low-risk pregnancy and has Sickle cell trait (HCC); Supervision of normal pregnancy; and Family history of breast cancer on their problem list.  Patient reports no complaints. Reports fetal movement. Denies any contractions, bleeding or leaking of fluid.   The following portions of the patient's history were reviewed and updated as appropriate: allergies, current medications, past family history, past medical history, past social history, past surgical history and problem list.   Objective:   General:  Alert, oriented and cooperative.   Mental Status: Normal mood and affect perceived. Normal judgment and thought content.  Rest of physical exam deferred due to type of encounter  BP 101/62   Pulse 61   LMP 01/17/2018 (Approximate)  **Done by patient's own at home BP cuff  no weigt recorded for today, patient has no scale at home  Assessment and Plan:  Pregnancy: G2P1001 at [redacted]w[redacted]d  Encounter for supervision of other normal pregnancy in third trimester - Discussed labor planning - wants to go natural for as long as she can, but is considering an epidural as next step - Reviewed BC options: Depo, PP IUD, Nexplanon and Paragard  desired BTL, but changed mind, is afraid of  hormonal options that will increase chances of BrCa  Advised the options given do not usually increase chances of BrCa, because of being progestin only  will send info via My Chart - My Chart OB [facility]  Term labor symptoms and general obstetric precautions including but not limited to vaginal bleeding, contractions, leaking of fluid and fetal movement were reviewed in detail with the patient.  I discussed the assessment and treatment plan with the patient. The patient was provided an opportunity to ask questions and all were answered. The patient agreed with the plan and demonstrated an understanding of the instructions. The patient was advised to call back or seek an in-person office evaluation/go to [facility] at [facility] for any urgent or concerning symptoms. Please refer to After Visit Summary for other counseling recommendations.   I provided 10 minutes of non-face-to-face time during this encounter. There was 5 minutes of chart review time spent prior to this encounter. Total time spent = 15 minutes.  Return in about 1 week (around 10/12/2018) for Return OB - My Chart video.  No future appointments.  Rolitta Dawson, CNM [facility], [facility]     

## 2018-10-12 ENCOUNTER — Telehealth: Payer: Medicaid Other | Admitting: Obstetrics and Gynecology

## 2018-10-17 ENCOUNTER — Telehealth (INDEPENDENT_AMBULATORY_CARE_PROVIDER_SITE_OTHER): Payer: Medicaid Other | Admitting: Women's Health

## 2018-10-17 ENCOUNTER — Encounter: Payer: Self-pay | Admitting: Women's Health

## 2018-10-17 ENCOUNTER — Other Ambulatory Visit: Payer: Self-pay

## 2018-10-17 VITALS — BP 102/75 | HR 69

## 2018-10-17 DIAGNOSIS — Z3A39 39 weeks gestation of pregnancy: Secondary | ICD-10-CM

## 2018-10-17 DIAGNOSIS — Z3483 Encounter for supervision of other normal pregnancy, third trimester: Secondary | ICD-10-CM

## 2018-10-17 NOTE — Progress Notes (Signed)
   TELEHEALTH VIRTUAL OBSTETRICS VISIT ENCOUNTER NOTE Patient name: Maria Henderson MRN [id number]  Date of birth: 1995/12/20  I connected with patient on 10/17/18 at  9:10 AM EDT by webex and verified that I am speaking with the correct person using two identifiers. Due to COVID-19 recommendations, pt is not currently in our office.    I discussed the limitations, risks, security and privacy concerns of performing an evaluation and management service by telephone and the availability of in person appointments. I also discussed with the patient that there may be a patient responsible charge related to this service. The patient expressed understanding and agreed to proceed.  Chief Complaint:   Routine Prenatal Visit  History of Present Illness:   Maria Henderson is a 23 y.o. G77P1001 female at [redacted]w[redacted]d with an Estimated Date of Delivery: 10/24/18 being evaluated today for ongoing management of a low-risk pregnancy.  Today she reports no complaints. Contractions: Not present. Vag. Bleeding: None.  Movement: Present. denies leaking of fluid. Review of Systems:   Pertinent items are noted in HPI Denies abnormal vaginal discharge w/ itching/odor/irritation, headaches, visual changes, shortness of breath, chest pain, abdominal pain, severe nausea/vomiting, or problems with urination or bowel movements unless otherwise stated above. Pertinent History Reviewed:  Reviewed past medical,surgical, social, obstetrical and family history.  Reviewed problem list, medications and allergies. Physical Assessment:   Vitals:   10/17/18 0921  BP: 102/75  Pulse: 69  There is no height or weight on file to calculate BMI.        Physical Examination:   General:  Alert, oriented and cooperative.   Mental Status: Normal mood and affect perceived. Normal judgment and thought content.  Rest of physical exam deferred due to type of encounter  No results found for this or any previous visit (from the past 24 hour(s)).   Assessment & Plan:  1) Pregnancy G2P1001 at [redacted]w[redacted]d with an Estimated Date of Delivery: 10/24/18    Meds: No orders of the defined types were placed in this encounter.  Labs/procedures today: none  Plan:  Continue routine obstetrical care.  Has home bp cuff.  Check bp weekly, let us know if >140/90.   Reviewed: Term labor symptoms and general obstetric precautions including but not limited to vaginal bleeding, contractions, leaking of fluid and fetal movement were reviewed in detail with the patient. The patient was advised to call back or seek an in-person office evaluation/go to MAU at [facility] for any urgent or concerning symptoms. All questions were answered. Please refer to After Visit Summary for other counseling recommendations.    I provided 10 minutes of non-face-to-face time during this encounter.  Follow-up: No follow-ups on file.  No orders of the defined types were placed in this encounter.  [place], [facility] 10/17/2018 9:42 AM

## 2018-10-17 NOTE — Patient Instructions (Signed)
Maria Henderson, I greatly value your feedback.  If you receive a survey following your visit with [place] today, we appreciate you taking the time to fill it out.  Thanks, Maria Henderson, CNM, [facility]  [place]!!! It is now [place] at [facility] ([place], [place] [address]) Entrance located off of [address] parking   Go to [email or web address] to register for FREE online childbirth classes    Call the office [phone]) or go to [facility] if:  You begin to have strong, frequent contractions  Your water breaks.  Sometimes it is a big gush of fluid, sometimes it is just a trickle that keeps getting your panties wet or running down your legs  You have vaginal bleeding.  It is normal to have a small amount of spotting if your cervix was checked.   You don't feel your baby moving like normal.  If you don't, get you something to eat and drink and lay down and focus on feeling your baby move.  You should feel at least 10 movements in 2 hours.  If you don't, you should call the office or go to [place] Blood Pressure Monitoring for Patients   Your provider has recommended that you check your blood pressure (BP) at least once a week at home. If you do not have a blood pressure cuff at home, one will be provided for you. Contact your provider if you have not received your monitor within 1 week.   Helpful Tips for Accurate Home Blood Pressure Checks   Don't smoke, exercise, or drink caffeine 30 minutes before checking your BP  Use the restroom before checking your BP (a full bladder can raise your pressure)  Relax in a comfortable upright chair  Feet on the ground  Left arm resting comfortably on a flat surface at the level of your heart  Legs uncrossed  Back supported  Sit quietly and don't talk  Place the cuff on your bare arm  Adjust snuggly, so that only two fingertips can fit between your skin  and the top of the cuff  Check 2 readings separated by at least one minute  Keep a log of your BP readings  For a visual, please reference this diagram: http://ccnc.care/bpdiagram  Provider Name: [facility]     Phone: [phone]  Zone 1: ALL CLEAR  Continue to monitor your symptoms:   BP reading is less than 140 (top number) or less than 90 (bottom number)   No right upper stomach pain  No headaches or seeing spots  No feeling nauseated or throwing up  No swelling in face and hands  Zone 2: CAUTION Call your doctor's office for any of the following:   BP reading is greater than 140 (top number) or greater than 90 (bottom number)   Stomach pain under your ribs in the middle or right side  Headaches or seeing spots  Feeling nauseated or throwing up  Swelling in face and hands  Zone 3: EMERGENCY  Seek immediate medical care if you have any of the following:   BP reading is greater than160 (top number) or greater than 110 (bottom number)  Severe headaches not improving with Tylenol  Serious difficulty catching your breath  Any worsening symptoms from Zone 2    Braxton Hicks Contractions Contractions of the uterus can occur throughout pregnancy, but they are not always a sign that you are  in labor. You may have practice contractions called Braxton Hicks contractions. These false labor contractions are sometimes confused with true labor. What are Maria Henderson contractions? Braxton Hicks contractions are tightening movements that occur in the muscles of the uterus before labor. Unlike true labor contractions, these contractions do not result in opening (dilation) and thinning of the cervix. Toward the end of pregnancy (32-34 weeks), Braxton Hicks contractions can happen more often and may become stronger. These contractions are sometimes difficult to tell apart from true labor because they can be very uncomfortable. You should not feel embarrassed if you go to  the hospital with false labor. Sometimes, the only way to tell if you are in true labor is for your health care provider to look for changes in the cervix. The health care provider will do a physical exam and may monitor your contractions. If you are not in true labor, the exam should show that your cervix is not dilating and your water has not broken. If there are no other health problems associated with your pregnancy, it is completely safe for you to be sent home with false labor. You may continue to have Braxton Hicks contractions until you go into true labor. How to tell the difference between true labor and false labor True labor  Contractions last 30-70 seconds.  Contractions become very regular.  Discomfort is usually felt in the top of the uterus, and it spreads to the lower abdomen and low back.  Contractions do not go away with walking.  Contractions usually become more intense and increase in frequency.  The cervix dilates and gets thinner. False labor  Contractions are usually shorter and not as strong as true labor contractions.  Contractions are usually irregular.  Contractions are often felt in the front of the lower abdomen and in the groin.  Contractions may go away when you walk around or change positions while lying down.  Contractions get weaker and are shorter-lasting as time goes on.  The cervix usually does not dilate or become thin. Follow these instructions at home:   Take over-the-counter and prescription medicines only as told by your health care provider.  Keep up with your usual exercises and follow other instructions from your health care provider.  Eat and drink lightly if you think you are going into labor.  If Braxton Hicks contractions are making you uncomfortable: ? Change your position from lying down or resting to walking, or change from walking to resting. ? Sit and rest in a tub of warm water. ? Drink enough fluid to keep your urine  pale yellow. Dehydration may cause these contractions. ? Do slow and deep breathing several times an hour.  Keep all follow-up prenatal visits as told by your health care provider. This is important. Contact a health care provider if:  You have a fever.  You have continuous pain in your abdomen. Get help right away if:  Your contractions become stronger, more regular, and closer together.  You have fluid leaking or gushing from your vagina.  You pass blood-tinged mucus (bloody show).  You have bleeding from your vagina.  You have low back pain that you never had before.  You feel your babys head pushing down and causing pelvic pressure.  Your baby is not moving inside you as much as it used to. Summary  Contractions that occur before labor are called Braxton Hicks contractions, false labor, or practice contractions.  Braxton Hicks contractions are usually shorter, weaker, farther apart, and  less regular than true labor contractions. True labor contractions usually become progressively stronger and regular, and they become more frequent.  Manage discomfort from [facility] contractions by changing position, resting in a warm bath, drinking plenty of water, or practicing deep breathing. This information is not intended to replace advice given to you by your health care provider. Make sure you discuss any questions you have with your health care provider. Document Released: 05/28/2016 Document Revised: 12/25/2016 Document Reviewed: 05/28/2016 Elsevier Patient Education  2020 [employer].

## 2018-10-22 ENCOUNTER — Inpatient Hospital Stay (HOSPITAL_COMMUNITY)
Admission: AD | Admit: 2018-10-22 | Discharge: 2018-10-24 | DRG: 807 | Disposition: A | Discharge status: Home / Self Care | Payer: Medicaid Other | Attending: Obstetrics & Gynecology | Admitting: Obstetrics & Gynecology

## 2018-10-22 ENCOUNTER — Other Ambulatory Visit: Payer: Self-pay

## 2018-10-22 ENCOUNTER — Encounter (HOSPITAL_COMMUNITY): Payer: Self-pay | Admitting: *Deleted

## 2018-10-22 DIAGNOSIS — Z20828 Contact with and (suspected) exposure to other viral communicable diseases: Secondary | ICD-10-CM | POA: Diagnosis present

## 2018-10-22 DIAGNOSIS — Z880 Allergy status to penicillin: Secondary | ICD-10-CM

## 2018-10-22 DIAGNOSIS — Z87891 Personal history of nicotine dependence: Secondary | ICD-10-CM | POA: Diagnosis not present

## 2018-10-22 DIAGNOSIS — O26893 Other specified pregnancy related conditions, third trimester: Secondary | ICD-10-CM | POA: Diagnosis present

## 2018-10-22 DIAGNOSIS — Z3A39 39 weeks gestation of pregnancy: Secondary | ICD-10-CM | POA: Diagnosis not present

## 2018-10-22 DIAGNOSIS — O9902 Anemia complicating childbirth: Secondary | ICD-10-CM | POA: Diagnosis present

## 2018-10-22 DIAGNOSIS — D573 Sickle-cell trait: Secondary | ICD-10-CM | POA: Diagnosis present

## 2018-10-22 LAB — CBC
HCT: 32.8 % — ABNORMAL LOW (ref 36.0–46.0)
Hemoglobin: 12.3 g/dL (ref 12.0–15.0)
MCH: 32.5 pg (ref 26.0–34.0)
MCHC: 37.5 g/dL — ABNORMAL HIGH (ref 30.0–36.0)
MCV: 86.5 fL (ref 80.0–100.0)
Platelets: 246 10*3/uL (ref 150–400)
RBC: 3.79 MIL/uL — ABNORMAL LOW (ref 3.87–5.11)
RDW: 13.5 % (ref 11.5–15.5)
WBC: 5.8 10*3/uL (ref 4.0–10.5)
nRBC: 0 % (ref 0.0–0.2)

## 2018-10-22 LAB — TYPE AND SCREEN
ABO/RH(D): B POS
Antibody Screen: NEGATIVE

## 2018-10-22 LAB — SARS CORONAVIRUS 2 BY RT PCR (HOSPITAL ORDER, PERFORMED IN ~~LOC~~ HOSPITAL LAB): SARS Coronavirus 2: NEGATIVE

## 2018-10-22 LAB — RPR: RPR Ser Ql: NONREACTIVE

## 2018-10-22 LAB — ABO/RH: ABO/RH(D): B POS

## 2018-10-22 MED ORDER — WITCH HAZEL-GLYCERIN EX PADS: 1.0000 "application " | MEDICATED_PAD | CUTANEOUS | Status: DC | PRN

## 2018-10-22 MED ORDER — OXYTOCIN 40 UNITS IN NORMAL SALINE INFUSION - SIMPLE MED
2.5000 [IU]/h | INTRAVENOUS | Status: DC
  Administered 2018-10-22: 11:00:00 2.5 [IU]/h via INTRAVENOUS
  Filled 2018-10-22: qty 1000

## 2018-10-22 MED ORDER — ONDANSETRON HCL 4 MG/2ML IJ SOLN: 4.0000 mg | Freq: Four times a day (QID) | INTRAMUSCULAR | Status: DC | PRN

## 2018-10-22 MED ORDER — LACTATED RINGERS IV SOLN: 500.0000 mL | Freq: Once | INTRAVENOUS | Status: DC

## 2018-10-22 MED ORDER — BENZOCAINE-MENTHOL 20-0.5 % EX AERO: 1.0000 "application " | INHALATION_SPRAY | CUTANEOUS | Status: DC | PRN

## 2018-10-22 MED ORDER — OXYTOCIN BOLUS FROM INFUSION
500.0000 mL | Freq: Once | INTRAVENOUS | Status: AC
  Administered 2018-10-22: 10:00:00 500 mL via INTRAVENOUS

## 2018-10-22 MED ORDER — EPHEDRINE 5 MG/ML INJ
10.0000 mg | INTRAVENOUS | Status: DC | PRN
  Filled 2018-10-22: qty 2

## 2018-10-22 MED ORDER — SENNOSIDES-DOCUSATE SODIUM 8.6-50 MG PO TABS
2.0000 | ORAL_TABLET | ORAL | Status: DC
  Administered 2018-10-22 – 2018-10-24 (×2): 2 via ORAL
  Filled 2018-10-22 (×2): qty 2

## 2018-10-22 MED ORDER — LACTATED RINGERS IV SOLN: 500.0000 mL | INTRAVENOUS | Status: DC | PRN

## 2018-10-22 MED ORDER — ACETAMINOPHEN 325 MG PO TABS
650.0000 mg | ORAL_TABLET | ORAL | Status: DC | PRN
  Administered 2018-10-22: 650 mg via ORAL
  Filled 2018-10-22: qty 2

## 2018-10-22 MED ORDER — ONDANSETRON HCL 4 MG PO TABS: 4.0000 mg | ORAL_TABLET | ORAL | Status: DC | PRN

## 2018-10-22 MED ORDER — FENTANYL CITRATE (PF) 100 MCG/2ML IJ SOLN: 50.0000 ug | INTRAMUSCULAR | Status: DC | PRN

## 2018-10-22 MED ORDER — SOD CITRATE-CITRIC ACID 500-334 MG/5ML PO SOLN: 30.0000 mL | ORAL | Status: DC | PRN

## 2018-10-22 MED ORDER — ONDANSETRON HCL 4 MG/2ML IJ SOLN: 4.0000 mg | INTRAMUSCULAR | Status: DC | PRN

## 2018-10-22 MED ORDER — DIPHENHYDRAMINE HCL 25 MG PO CAPS: 25.0000 mg | ORAL_CAPSULE | Freq: Four times a day (QID) | ORAL | Status: DC | PRN

## 2018-10-22 MED ORDER — IBUPROFEN 600 MG PO TABS
600.0000 mg | ORAL_TABLET | Freq: Four times a day (QID) | ORAL | Status: DC
  Administered 2018-10-22 – 2018-10-24 (×7): 600 mg via ORAL
  Filled 2018-10-22 (×9): qty 1

## 2018-10-22 MED ORDER — LACTATED RINGERS IV BOLUS
1000.0000 mL | Freq: Once | INTRAVENOUS | Status: AC
  Administered 2018-10-22: 1000 mL via INTRAVENOUS

## 2018-10-22 MED ORDER — PHENYLEPHRINE 40 MCG/ML (10ML) SYRINGE FOR IV PUSH (FOR BLOOD PRESSURE SUPPORT)
80.0000 ug | PREFILLED_SYRINGE | INTRAVENOUS | Status: DC | PRN
  Filled 2018-10-22: qty 10

## 2018-10-22 MED ORDER — PRENATAL MULTIVITAMIN CH
1.0000 | ORAL_TABLET | Freq: Every day | ORAL | Status: DC
  Administered 2018-10-23: 1 via ORAL
  Filled 2018-10-22: qty 1

## 2018-10-22 MED ORDER — FENTANYL-BUPIVACAINE-NACL 0.5-0.125-0.9 MG/250ML-% EP SOLN
12.0000 mL/h | EPIDURAL | Status: DC | PRN
  Filled 2018-10-22: qty 250

## 2018-10-22 MED ORDER — ACETAMINOPHEN 325 MG PO TABS
650.0000 mg | ORAL_TABLET | ORAL | Status: DC | PRN
  Administered 2018-10-22: 650 mg via ORAL
  Filled 2018-10-22 (×2): qty 2

## 2018-10-22 MED ORDER — SIMETHICONE 80 MG PO CHEW: 80.0000 mg | CHEWABLE_TABLET | ORAL | Status: DC | PRN

## 2018-10-22 MED ORDER — TERBUTALINE SULFATE 1 MG/ML IJ SOLN
INTRAMUSCULAR | Status: AC
  Administered 2018-10-22: 09:00:00 0.25 mg
  Filled 2018-10-22: qty 1

## 2018-10-22 MED ORDER — LACTATED RINGERS IV SOLN: INTRAVENOUS | Status: DC

## 2018-10-22 MED ORDER — OXYCODONE-ACETAMINOPHEN 5-325 MG PO TABS: 1.0000 | ORAL_TABLET | ORAL | Status: DC | PRN

## 2018-10-22 MED ORDER — TETANUS-DIPHTH-ACELL PERTUSSIS 5-2.5-18.5 LF-MCG/0.5 IM SUSP: 0.5000 mL | Freq: Once | INTRAMUSCULAR | Status: DC

## 2018-10-22 MED ORDER — COCONUT OIL OIL
1.0000 "application " | TOPICAL_OIL | Status: DC | PRN
  Administered 2018-10-24: 1 via TOPICAL

## 2018-10-22 MED ORDER — OXYCODONE-ACETAMINOPHEN 5-325 MG PO TABS: 2.0000 | ORAL_TABLET | ORAL | Status: DC | PRN

## 2018-10-22 MED ORDER — ZOLPIDEM TARTRATE 5 MG PO TABS: 5.0000 mg | ORAL_TABLET | Freq: Every evening | ORAL | Status: DC | PRN

## 2018-10-22 MED ORDER — TERBUTALINE SULFATE 1 MG/ML IJ SOLN: 0.2500 mg | Freq: Once | INTRAMUSCULAR | Status: DC

## 2018-10-22 MED ORDER — DIBUCAINE (PERIANAL) 1 % EX OINT: 1.0000 "application " | TOPICAL_OINTMENT | CUTANEOUS | Status: DC | PRN

## 2018-10-22 MED ORDER — DIPHENHYDRAMINE HCL 50 MG/ML IJ SOLN: 12.5000 mg | INTRAMUSCULAR | Status: DC | PRN

## 2018-10-22 MED ORDER — LIDOCAINE HCL (PF) 1 % IJ SOLN: 30.0000 mL | INTRAMUSCULAR | Status: DC | PRN

## 2018-10-22 NOTE — H&P (Signed)
LABOR AND DELIVERY ADMISSION HISTORY AND PHYSICAL NOTE  Maria Henderson is a 23 y.o. female G2P1001 with IUP at 5373w5d by LMP presenting for SOL. She reports positive fetal movement. She denies  vaginal bleeding. AROM in MAU @0912  with FSE placement for fetal decelerations.  Prenatal History/Complications: PNC at FT Pregnancy complications:  - Sickle cell trait  - Family hx of breast cancer   Past Medical History: Past Medical History:  Diagnosis Date  . Medical history non-contributory     Past Surgical History: Past Surgical History:  Procedure Laterality Date  . arm surgery Right    broke arm at 23 years old  . WISDOM TOOTH EXTRACTION      Obstetrical History: OB History    Gravida  2   Para  1   Term  1   Preterm      AB      Living  1     SAB      TAB      Ectopic      Multiple  0   Live Births  1           Social History: Social History   Socioeconomic History  . Marital status: Single    Spouse name: Dion Pettiford  . Number of children: 2  . Years of education: 4112  . Highest education level: 12th grade  Occupational History  . Not on file  Social Needs  . Financial resource strain: Not very hard  . Food insecurity    Worry: Never true    Inability: Never true  . Transportation needs    Medical: No    Non-medical: No  Tobacco Use  . Smoking status: Former Smoker    Years: 1.00    Types: Cigarettes  . Smokeless tobacco: Never Used  Substance and Sexual Activity  . Alcohol use: Not Currently    Comment: not now  . Drug use: No  . Sexual activity: Yes    Birth control/protection: None  Lifestyle  . Physical activity    Days per week: 2 days    Minutes per session: 20 min  . Stress: To some extent  Relationships  . Social Musicianconnections    Talks on phone: Three times a week    Gets together: Once a week    Attends religious service: More than 4 times per year    Active member of club or organization: No    Attends meetings  of clubs or organizations: Never    Relationship status: Living with partner  Other Topics Concern  . Not on file  Social History Narrative  . Not on file    Family History: Family History  Problem Relation Age of Onset  . Diabetes Paternal Grandmother   . Breast cancer Mother   . Diabetes Paternal Aunt     Allergies: Allergies  Allergen Reactions  . Penicillins Anaphylaxis and Swelling    Has patient had a PCN reaction causing immediate rash, facial/tongue/throat swelling, SOB or lightheadedness with hypotension: Yes Has patient had a PCN reaction causing severe rash involving mucus membranes or skin necrosis: Yes Has patient had a PCN reaction that required hospitalization: No Has patient had a PCN reaction occurring within the last 10 years: No If all of the above answers are "NO", then may proceed with Cephalosporin use.   Maria Henderson. Kiwi Extract Other (See Comments)    Mouth ulcers    Medications Prior to Admission  Medication Sig Dispense Refill Last  Dose  . acetaminophen (TYLENOL) 325 MG tablet Take 325 mg by mouth every 6 (six) hours as needed for mild pain or headache.     . Blood Pressure Monitor MISC For regular home bp monitoring during pregnancy 1 each 0   . Prenatal Vit-Fe Fumarate-FA (PRENATAL VITAMIN PO) Take by mouth daily.        Review of Systems  All systems reviewed and negative except as stated in HPI  Physical Exam Blood pressure (!) 94/50, pulse 88, temperature 98 F (36.7 C), temperature source Oral, resp. rate 20, last menstrual period 01/17/2018, SpO2 100 %. General appearance: alert, cooperative and mild distress Lungs: clear to auscultation bilaterally Heart: regular rate and rhythm Abdomen: soft, non-tender; bowel sounds normal Extremities: No calf swelling or tenderness Presentation: cephalic Fetal monitoring: 120/ moderate/ +accels/ variable decelerations  Uterine activity: 3-7 minutes after terb given in MAU  Dilation: 8.5 Effacement (%):  90 Station: -1 Exam by:: Maria Henderson  Prenatal labs: ABO, Rh: B/Positive/-- (05/14 1539) Antibody: Negative (07/10 0846) Rubella: 10.20 (05/14 1539) RPR: Non Reactive (07/10 0846)  HBsAg: Negative (05/14 1539)  HIV: Non Reactive (07/10 0846)  GC/Chlamydia: Negative  GBS: --Maria Henderson (09/02 1230)  2 hr Glucola: [phone] Genetic screening:  AFP negative  Anatomy US: normal female    FAMILY TREE  LAB RESULTS  Language English Pap 12/24/15 -  Initiated care at [facility] GC/CT Initial:   -/-         36wks:neg/neg  Dating by LMP c/w 18wk U/s    Support person [employer] NT/IT:too late  AFP:  neg    MaterniT21:    [place]/HgbE 12/24/15 +  Flu vaccine  CF declined  TDaP vaccine  SMA declined  Rhogam n/a Fragile X declined       Anatomy US Normal female 'Zayden', EFW 90% Blood Type B/Positive/-- (05/14 1539)  Feeding Plan breast Antibody Negative (05/14 1539)  Contraception discussed HBsAg Negative (05/14 1539)  Circumcision Yes, at FT RPR Non Reactive (05/14 1539)  Pediatrician [facility] Rubella  10.20 (05/14 1539)  Prenatal Classes declined HIV Non Reactive (05/14 1539)    GTT/A1C Early:      26-28wks: 75/140/122  BTL Consent n/a GBS    neg    [ ]  PCN allergy  VBAC Consent n/a    Waterbirth [ ] Class [ ] Consent [ ] CNM visit PP Needs       Prenatal Transfer Tool  Maternal Diabetes: No Genetic Screening: Normal Maternal Ultrasounds/Referrals: Normal Fetal Ultrasounds or other Referrals:  None Maternal Substance Abuse:  No Significant Maternal Medications:  None Significant Maternal Lab Results: Group B Strep negative  Results for orders placed or performed during the hospital encounter of 10/22/18 (from the past 24 hour(s))  CBC   Collection Time: 10/22/18  9:11 AM  Result Value Ref Range   WBC 5.8 4.0 - 10.5 K/uL   RBC 3.79 (L) 3.87 - 5.11 MIL/uL   Hemoglobin 12.3 12.0 - 15.0 g/dL   HCT 32.8 (L) 36.0 - 46.0 %   MCV 86.5 80.0 - 100.0 fL   MCH 32.5 26.0 - 34.0 pg   MCHC  37.5 (H) 30.0 - 36.0 g/dL   RDW 13.5 11.5 - 15.5 %   Platelets 246 150 - 400 K/uL   nRBC 0.0 0.0 - 0.2 %    Patient Active Problem List   Diagnosis Date Noted  . Normal labor 10/22/2018  . Family history of breast cancer 09/20/2018  . Supervision of normal pregnancy  06/09/2018  . Sickle cell trait (HCC) 12/26/2015    Assessment: Maria Henderson is a 23 y.o. G2P1001 at [redacted]w[redacted]d here for SOL  #Labor: Rapid progression  #Pain: Plans epidural  #FWB: Cat II  #ID:  GBS neg  #MOF: Breast  #MOC:Unsure  #Circ:  Yes outpatient   Sharyon Cable, CNM 10/22/2018, 9:55 AM

## 2018-10-22 NOTE — Discharge Summary (Addendum)
Postpartum Discharge Summary    Patient Name: Maria Henderson DOB: 1995-06-09 MRN: [id number]  Date of admission: 10/22/2018 Delivering Provider: Wende Mott   Date of discharge: 10/23/2018  Admitting diagnosis: CXT Intrauterine pregnancy: [redacted]w[redacted]d    Secondary diagnosis:  Active Problems:   Normal labor  Additional problems: n/a     Discharge diagnosis: Term Pregnancy Delivered                                                                                                Post partum procedures:n/a  Augmentation: AROM  Complications: None  Hospital course:  Onset of Labor With Vaginal Delivery     23y.o. yo G2P1001 at 329w5das admitted in Active Labor on 10/22/2018. Patient had an uncomplicated labor course as follows:  Membrane Rupture Time/Date: 9:13 AM ,10/22/2018   Intrapartum Procedures: Episiotomy: None [1]                                         Lacerations:  Periurethral [8]  Patient had a delivery of a Viable infant. 10/22/2018  Information for the patient's newborn:  FuWende, [place]0[[id number]]Delivery Method: Vag-Spont     Pateint had an uncomplicated postpartum course.  She is ambulating, tolerating a regular diet, passing flatus, and urinating well. Patient is discharged home in stable condition on 10/23/18.  Delivery time: 10:07 AM    Magnesium Sulfate received: No BMZ received: No Rhophylac:N/A MMR:No Transfusion:No  Physical exam  Vitals:   10/22/18 1400 10/22/18 1700 10/22/18 2100 10/23/18 0515  BP: 106/68 108/86 110/66 101/68  Pulse: 80 82 97 80  Resp: 17 16 18 17   Temp: 98.1 F (36.7 C) 98 F (36.7 C) 98.9 F (37.2 C) 98.4 F (36.9 C)  TempSrc: Oral Oral Oral Oral  SpO2: 100% 100% 100% 100%  Weight:      Height:       General: alert, cooperative and no distress  Cardiac: RRR, no rubs or gallops Pulm: chest clear, no crackles or wheeze, no respiratory distress GI: Abdo soft, non tender, bowel sounds present Lochia:  appropriate Uterine Fundus: firm Incision: N/A DVT Evaluation: No cords or calf tenderness. No significant calf/ankle edema.  Labs: Lab Results  Component Value Date   WBC 10.7 (H) 10/23/2018   HGB 10.6 (L) 10/23/2018   HCT 29.6 (L) 10/23/2018   MCV 87.8 10/23/2018   PLT 235 10/23/2018   No flowsheet data found.  Discharge instruction: per After Visit Summary and "Baby and Me Booklet".  After visit meds:  Allergies as of 10/23/2018      Reactions   Penicillins Anaphylaxis, Swelling   Has patient had a PCN reaction causing immediate rash, facial/tongue/throat swelling, SOB or lightheadedness with hypotension: Yes Has patient had a PCN reaction causing severe rash involving mucus membranes or skin necrosis: Yes Has patient had a PCN reaction that required hospitalization: No Has patient had a PCN reaction occurring within the last 10 years: No If all of  the above answers are "NO", then may proceed with Cephalosporin use.   Kiwi Extract Other (See Comments)   Mouth ulcers      Medication List    TAKE these medications   acetaminophen 325 MG tablet Commonly known as: Tylenol Take 2 tablets (650 mg total) by mouth every 4 (four) hours as needed (for pain scale < 4). What changed:   how much to take  when to take this  reasons to take this   Blood Pressure Monitor Misc For regular home bp monitoring during pregnancy   ibuprofen 600 MG tablet Commonly known as: ADVIL Take 1 tablet (600 mg total) by mouth every 6 (six) hours.   PRENATAL VITAMIN PO Take by mouth daily.   senna-docusate 8.6-50 MG tablet Commonly known as: Senokot-S Take 2 tablets by mouth 2 (two) times daily.       Diet: routine diet  Activity: Advance as tolerated. Pelvic rest for 6 weeks.   Outpatient follow up:6 weeks Follow up Appt:No future appointments. Follow up Visit:   Please schedule this patient for PP visit in: 4 weeks Low risk pregnancy complicated by: n/a Delivery mode:   SVD Anticipated Birth Control:  Paragard PP Procedures needed: n/a  Schedule Integrated BH visit: no Provider: Any provider  Newborn Data: Live born female 99g Birth Weight:   APGAR: 60, 9  Newborn Delivery   Birth date/time: 10/22/2018 10:07:00 Delivery type: Vaginal, Spontaneous      Baby Feeding: Breast Disposition:home with mother   Midwife attestation I have seen and examined this patient and agree with above documentation in the resident's note.   Maria Henderson is a 23 y.o. G2P1001 s/p SVD.  Pain is well controlled. Plan for birth control is IUD. Method of Feeding: breast  PE:  Gen: well appearing Heart: reg rate Lungs: normal WOB Fundus firm Ext: no pain, no edema  Recent Labs    10/22/18 0911 10/23/18 0430  HGB 12.3 10.6*  HCT 32.8* 29.6*    Assessment S/p SVD PPD # 1  Plan: - discharge home - postpartum care discussed - f/u in office in 4 weeks for postpartum visit   Julianne Handler, CNM 11:13 AM

## 2018-10-22 NOTE — MAU Note (Signed)
Maria Henderson is a 23 y.o. at [redacted]w[redacted]d here in MAU reporting: contractions since yesterday, this AM they started coming back to back. Her mucus plug came out and saw some blood in that. States she started LOF yesterday. It is clear. + FM  Onset of complaint: yesterday  Pain score: 8/10  Vitals:   10/22/18 0848  BP: (!) 94/50  Pulse: 88  Resp: 20  Temp: 98 F (36.7 C)  SpO2: 100%     FHT: +FM  Lab orders placed from triage: none

## 2018-10-22 NOTE — Progress Notes (Signed)
CNM called to room in MAU by RN for fetal decelerations  CNM to room at 0859- FHR 90 when entering room   Position changed from hands and knees, to left lateral  Cervix checked 8.5/90/-1, positive scalp stimulation @ 0901 Rapid change from 7 to 8.5   FHR down to 90s again after scalp stimulation  Terb 0.25 ordered @ 0903, position changed to right lateral  Dr Harolyn Rutherford called to bedside   Terb given @0904 , Dr Harolyn Rutherford at bedside  Position changed to semi fowlers   Fetal heart rate recovered to baseline of 120s @0908  FSE placed by Dr Harolyn Rutherford @ 0913  FHR stable and no decelerations prior to transfer to L&D  Lajean Manes, CNM 10/22/18, 9:48 AM

## 2018-10-22 NOTE — Lactation Note (Signed)
This note was copied from a baby's chart. Lactation Consultation Note  Patient Name: Maria Henderson OVFIE'P Date: 10/22/2018 Reason for consult: Initial assessment;Term  62 hours old FT female who is being exclusively BF by his mother, she's a P2 and experienced BF. She was able to BF her first child for 23 months but faced some BF difficulties in the beginning. Baby got jaundice due to a difficult latch and once she got home she started getting yeast infections, but eventually they got under control. Mom is already familiar with hand expression and able to get some colostrum when doing so. When [facility] offered though, she politely declined, she told LC that baby is very spitty and she's afraid of feeding him at this point. She has a Medela hand pump at home.  Offered assistance with latch but LC also noted that baby was very spitty and not ready to feed. Asked mom to call for assistance when needed. Reviewed normal newborn behavior, size of baby's stomach, feeding cues, cluster feeding and BF basics. Mom aware to call her RN in case baby starts choking, she was worried about falling asleep and baby having a choking episode. Reassured to mom that nursing staff is here to help her.  Feeding plan:  1. Encouraged mom to feed baby STS 8-12 times/24 hours or sooner if feeding cues are present 2. Hand expression and spoon feeding were also encouraged, but baby not even cuing at this point  BF brochure, BF resources and feeding diary were reviewed. Mom reported all questions and concerns were answered, she's aware of [facility] OP services and will call PRN.    Maternal Data Formula Feeding for Exclusion: Yes Reason for exclusion: Mother's choice to formula and breast feed on admission Has patient been taught Hand Expression?: Yes Does the patient have breastfeeding experience prior to this delivery?: Yes  Feeding    LATCH Score Latch: Too sleepy or reluctant, no latch achieved, no sucking  elicited.  Audible Swallowing: None  Type of Nipple: Everted at rest and after stimulation  Comfort (Breast/Nipple): Soft / non-tender  Hold (Positioning): No assistance needed to correctly position infant at breast.  LATCH Score: 6  Interventions Interventions: Breast feeding basics reviewed  Lactation Tools Discussed/Used WIC Program: No   Consult Status Consult Status: Follow-up Date: 10/23/18 Follow-up type: In-patient    Maria Henderson 10/22/2018, 7:52 PM

## 2018-10-23 ENCOUNTER — Encounter (HOSPITAL_COMMUNITY): Payer: Self-pay | Admitting: *Deleted

## 2018-10-23 LAB — CBC
HCT: 29.6 % — ABNORMAL LOW (ref 36.0–46.0)
Hemoglobin: 10.6 g/dL — ABNORMAL LOW (ref 12.0–15.0)
MCH: 31.5 pg (ref 26.0–34.0)
MCHC: 35.8 g/dL (ref 30.0–36.0)
MCV: 87.8 fL (ref 80.0–100.0)
Platelets: 235 10*3/uL (ref 150–400)
RBC: 3.37 MIL/uL — ABNORMAL LOW (ref 3.87–5.11)
RDW: 13.3 % (ref 11.5–15.5)
WBC: 10.7 10*3/uL — ABNORMAL HIGH (ref 4.0–10.5)
nRBC: 0 % (ref 0.0–0.2)

## 2018-10-23 MED ORDER — ACETAMINOPHEN 325 MG PO TABS: 650.0000 mg | ORAL_TABLET | ORAL | 1 refills | Status: DC | PRN

## 2018-10-23 MED ORDER — IBUPROFEN 600 MG PO TABS: 600.0000 mg | ORAL_TABLET | Freq: Four times a day (QID) | ORAL | 0 refills | Status: DC

## 2018-10-23 MED ORDER — SENNOSIDES-DOCUSATE SODIUM 8.6-50 MG PO TABS: 2.0000 | ORAL_TABLET | Freq: Two times a day (BID) | ORAL | 0 refills | Status: AC

## 2018-10-23 NOTE — Discharge Instructions (Signed)

## 2018-10-23 NOTE — Lactation Note (Signed)
This note was copied from a baby's chart. Lactation Consultation Note  Patient Name: Maria Henderson [id number] Date: 10/23/2018 Reason for consult: Follow-up assessment;Term  P2 mother whose infant is now 25 hours old.  Mother breast fed her first child (now 24 years old) for 23 months.  Mother had some concerns last night with baby's episodes of emesis.  She was afraid to feed him.  Baby was asleep at this time.  Mother had most recently provided 5 mls of formula supplementation.  Encouraged mother to call for latch assistance the next time he shows cues.  Per RN, he latches and feeds well.    Encouraged to continue observing for feeding cues which he demonstrates.  She will feed 8-12 times/24 hours or sooner if he shows cues.  Continue hand expression before/after feedings to help increase milk supply.  Mother will also post pump for 15 minutes and feed back any EBM she obtains with pumping.  DEBP was initiated per RN today.    Mother verbalized understanding of feeding plan and will call her RN/LC for assistance at the next feeding.  RN updated.   Maternal Data Formula Feeding for Exclusion: No Has patient been taught Hand Expression?: Yes Does the patient have breastfeeding experience prior to this delivery?: Yes  Feeding Feeding Type: Bottle Fed - Formula  LATCH Score Latch: Too sleepy or reluctant, no latch achieved, no sucking elicited.  Audible Swallowing: A few with stimulation  Type of Nipple: Everted at rest and after stimulation  Comfort (Breast/Nipple): Soft / non-tender  Hold (Positioning): No assistance needed to correctly position infant at breast.  LATCH Score: 7  Interventions Interventions: Breast feeding basics reviewed;Assisted with latch;Skin to skin;Breast massage;Hand express;Pre-pump if needed;Breast compression;DEBP  Lactation Tools Discussed/Used Pump Review: Setup, frequency, and cleaning;Milk Storage Initiated by:: Melina Fiddler, RN Date  initiated:: 10/23/18   Consult Status Consult Status: Follow-up Date: 10/24/18 Follow-up type: In-patient    [name] 10/23/2018, 12:25 PM

## 2018-10-24 ENCOUNTER — Encounter (HOSPITAL_COMMUNITY): Payer: Self-pay | Admitting: *Deleted

## 2018-10-24 ENCOUNTER — Other Ambulatory Visit: Payer: Medicaid Other | Admitting: Women's Health

## 2018-10-24 NOTE — Lactation Note (Signed)
This note was copied from a baby's chart. Lactation Consultation Note  Patient Name: Maria Henderson [id number] Date: 10/24/2018 Reason for consult: Follow-up assessment;Term;Difficult latch;Infant weight loss P2, 38 hour female infant, weight loss -4%. Infant had 6 voids and 8 stools.  Per mom, infant is not longer spitty and is currently cluster feeding. Per mom, infant started latching well around 2:30 pm yesterday.  LC entered room mom was breastfeeding infant on her left breast using the football hold. LC observed swallows and infant was still breastfeeding after 15 minutes when LC left the room. Mom feels breastfeeding is going well and she has  used DEBP  only twice now that infant is latching well at breast. Mom is experienced at breastfeeding she breastfeed her 86 year old daughter for 23 months. Mom's feeding choice is breast and formula feeding. Per mom, she has been breastfeeding infant first and then supplementing with formula. Infant has been breast and supplemented with formula 4 times that is her preferred feeding choice.  Mom knows to breastfeed infant according hunger cues, 3 hours or less per feeding and on demand. Mom knows to call Nurse or [place]  if she has any questions, concerns or need assistance with latching infant to breast.   Maternal Data Reason for exclusion: Mother's choice to formula and breast feed on admission  Feeding Feeding Type: (P) Breast Fed  LATCH Score Latch: Grasps breast easily, tongue down, lips flanged, rhythmical sucking.  Audible Swallowing: Spontaneous and intermittent  Type of Nipple: Everted at rest and after stimulation  Comfort (Breast/Nipple): Soft / non-tender  Hold (Positioning): No assistance needed to correctly position infant at breast.  LATCH Score: 10  Interventions Interventions: Skin to skin;Breast massage;DEBP  Lactation Tools Discussed/Used     Consult Status Consult Status: Follow-up Date: 10/24/18 Follow-up  type: In-patient    Vicente Serene 10/24/2018, 12:43 AM

## 2018-10-24 NOTE — Discharge Summary (Signed)
Postpartum Discharge Summary     Patient Name: Maria Henderson DOB: 1995/11/14 MRN: [id number]  Date of admission: 10/22/2018 Delivering Provider: Wende Mott   Date of discharge: 10/24/2018  Admitting diagnosis: contractions Intrauterine pregnancy: [redacted]w[redacted]d    Secondary diagnosis:  Active Problems:   Normal labor   SVD (spontaneous vaginal delivery)  Additional problems: n/a     Discharge diagnosis: Term Pregnancy Delivered                                                                                                Post partum procedures:none  Augmentation: AROM  Complications: None  Hospital course:  Onset of Labor With Vaginal Delivery     23y.o. yo G2P1001 at 349w5das admitted in Active Labor on 10/22/2018. Patient had an uncomplicated labor course as follows:  Membrane Rupture Time/Date: 9:13 AM ,10/22/2018   Intrapartum Procedures: Episiotomy: None [1]                                         Lacerations:  Periurethral [8]  Patient had a delivery of a Viable infant. 10/22/2018  Information for the patient's newborn:  FuJosilyn, [place]0[[id number]]Delivery Method: Vag-Spont     Pateint had an uncomplicated postpartum course.  She is ambulating, tolerating a regular diet, passing flatus, and urinating well. Patient is discharged home in stable condition on 10/24/18.  Delivery time: 10:07 AM    Magnesium Sulfate received: No BMZ received: No Rhophylac:N/A MMR:N/A Transfusion:No  Physical exam  Vitals:   10/22/18 2100 10/23/18 0515 10/23/18 2048 10/24/18 0601  BP: 110/66 101/68 (!) 99/54 106/63  Pulse: 97 80 84 70  Resp: 18 17 18 18   Temp: 98.9 F (37.2 C) 98.4 F (36.9 C) 98.6 F (37 C) 98.8 F (37.1 C)  TempSrc: Oral Oral Oral   SpO2: 100% 100% 100% 100%  Weight:      Height:       General: alert, cooperative and no distress Lochia: appropriate Uterine Fundus: firm Incision: N/A DVT Evaluation: No evidence of DVT seen on physical  exam. Labs: Lab Results  Component Value Date   WBC 10.7 (H) 10/23/2018   HGB 10.6 (L) 10/23/2018   HCT 29.6 (L) 10/23/2018   MCV 87.8 10/23/2018   PLT 235 10/23/2018   No flowsheet data found.  Discharge instruction: per After Visit Summary and "Baby and Me Booklet".  After visit meds:  Allergies as of 10/24/2018      Reactions   Penicillins Anaphylaxis, Swelling   Has patient had a PCN reaction causing immediate rash, facial/tongue/throat swelling, SOB or lightheadedness with hypotension: Yes Has patient had a PCN reaction causing severe rash involving mucus membranes or skin necrosis: Yes Has patient had a PCN reaction that required hospitalization: No Has patient had a PCN reaction occurring within the last 10 years: No If all of the above answers are "NO", then may proceed with Cephalosporin use.   Kiwi Extract Other (See Comments)  Mouth ulcers      Medication List    TAKE these medications   acetaminophen 325 MG tablet Commonly known as: TYLENOL Take 325 mg by mouth every 6 (six) hours as needed for mild pain or headache.   Blood Pressure Monitor Misc For regular home bp monitoring during pregnancy   ibuprofen 600 MG tablet Commonly known as: ADVIL Take 1 tablet (600 mg total) by mouth every 6 (six) hours.   PRENATAL VITAMIN PO Take by mouth daily.   senna-docusate 8.6-50 MG tablet Commonly known as: Senokot-S Take 2 tablets by mouth 2 (two) times daily.       Diet: routine diet  Activity: Advance as tolerated. Pelvic rest for 6 weeks.   Outpatient follow up:4-6 weeks Follow up Appt:No future appointments. Follow up Visit: Follow-up Information    [facility] Follow up.   Specialty: Obstetrics and Gynecology Why: The office will contact you to schedule your postpartum appointment in 4-6 weeks Contact information: [place] [facility] Follow up.    Specialty: Obstetrics and Gynecology Why: go for postpartum related complications Contact information: [address] [id number] [place] [phone]           Please schedule this patient for Postpartum visit in: 4 weeks with the following provider: Any provider For C/S patients schedule nurse incision check in weeks 2 weeks: no Low risk pregnancy complicated by: none Delivery mode:  SVD Anticipated Birth Control:  paragard PP Procedures needed: none  Schedule Integrated [place] visit: no      Newborn Data: Live born female  Birth Weight: 7 lb 7.9 oz (3400 g) APGAR: 8, 9  Newborn Delivery   Birth date/time: 10/22/2018 10:07:00 Delivery type: Vaginal, Spontaneous      Baby Feeding: Breast Disposition:home with mother   10/24/2018 Jorje Guild, NP

## 2018-11-25 ENCOUNTER — Ambulatory Visit: Payer: Medicaid Other | Admitting: Women's Health

## 2019-09-09 ENCOUNTER — Encounter: Payer: Self-pay | Admitting: Emergency Medicine

## 2019-09-09 ENCOUNTER — Ambulatory Visit
Admission: EM | Admit: 2019-09-09 | Discharge: 2019-09-09 | Disposition: A | Discharge status: Home / Self Care | Payer: Medicaid Other | Attending: Emergency Medicine | Admitting: Emergency Medicine

## 2019-09-09 ENCOUNTER — Other Ambulatory Visit: Payer: Self-pay

## 2019-09-09 DIAGNOSIS — R1030 Lower abdominal pain, unspecified: Secondary | ICD-10-CM | POA: Diagnosis not present

## 2019-09-09 DIAGNOSIS — M545 Low back pain, unspecified: Secondary | ICD-10-CM

## 2019-09-09 LAB — POCT URINE PREGNANCY: Preg Test, Ur: NEGATIVE

## 2019-09-09 LAB — POCT URINALYSIS DIP (MANUAL ENTRY)
Bilirubin, UA: NEGATIVE
Glucose, UA: NEGATIVE mg/dL
Ketones, POC UA: NEGATIVE mg/dL
Leukocytes, UA: NEGATIVE
Nitrite, UA: NEGATIVE
Protein Ur, POC: NEGATIVE mg/dL
Spec Grav, UA: 1.025 (ref 1.010–1.025)
Urobilinogen, UA: 0.2 E.U./dL
pH, UA: 6 (ref 5.0–8.0)

## 2019-09-09 MED ORDER — PHENAZOPYRIDINE HCL 100 MG PO TABS: 100.0000 mg | ORAL_TABLET | Freq: Three times a day (TID) | ORAL | 0 refills | Status: DC | PRN

## 2019-09-09 NOTE — ED Provider Notes (Signed)
[facility]   CC: Burning with urination  SUBJECTIVE:  Maria Henderson is a 24 y.o. female with history of UTI presented to the urgent care for complaint of lower abdomen and lower back pain that started this morning.  Patient denies a precipitating event, recent sexual encounter, excessive caffeine intake.  Localizes the pain to the lower abdomen.  Pain is intermittent and describes it as achy.  Has tried OTC medications without relief.  Symptoms are made worse with urination.  Admits to similar symptoms in the past.  Denies fever, chills, nausea, vomiting, abnormal vaginal discharge or bleeding, hematuria.    LMP: Patient's last menstrual period was 08/31/2019.  ROS: As in HPI.  All other pertinent ROS negative.     Past Medical History:  Diagnosis Date   Medical history non-contributory    Past Surgical History:  Procedure Laterality Date   arm surgery Right    broke arm at 24 years old   WISDOM TOOTH EXTRACTION     Allergies  Allergen Reactions   Penicillins Anaphylaxis and Swelling    Has patient had a PCN reaction causing immediate rash, facial/tongue/throat swelling, SOB or lightheadedness with hypotension: Yes Has patient had a PCN reaction causing severe rash involving mucus membranes or skin necrosis: Yes Has patient had a PCN reaction that required hospitalization: No Has patient had a PCN reaction occurring within the last 10 years: No If all of the above answers are "NO", then may proceed with Cephalosporin use.    Kiwi Extract Other (See Comments)    Mouth ulcers   No current facility-administered medications on file prior to encounter.   Current Outpatient Medications on File Prior to Encounter  Medication Sig Dispense Refill   acetaminophen (TYLENOL) 325 MG tablet Take 325 mg by mouth every 6 (six) hours as needed for mild pain or headache.     Blood Pressure Monitor MISC For regular home bp monitoring during pregnancy 1 each 0   ibuprofen  (ADVIL) 600 MG tablet Take 1 tablet (600 mg total) by mouth every 6 (six) hours. 30 tablet 0   Prenatal Vit-Fe Fumarate-FA (PRENATAL VITAMIN PO) Take by mouth daily.     Social History   Socioeconomic History   Marital status: Single    Spouse name: Dion Pettiford   Number of children: 2   Years of education: 12   Highest education level: 12th grade  Occupational History   Not on file  Tobacco Use   Smoking status: Former Smoker    Years: 1.00    Types: Cigarettes   Smokeless tobacco: Never Used  Building services engineer Use: Never used  Substance and Sexual Activity   Alcohol use: Not Currently    Comment: not now   Drug use: No   Sexual activity: Yes    Birth control/protection: None  Other Topics Concern   Not on file  Social History Narrative   Not on file   Social Determinants of Health   Financial Resource Strain:    Difficulty of Paying Living Expenses:   Food Insecurity:    Worried About Programme researcher, broadcasting/film/video in the Last Year:    Barista in the Last Year:   Transportation Needs:    Freight forwarder (Medical):    Lack of Transportation (Non-Medical):   Physical Activity:    Days of Exercise per Week:    Minutes of Exercise per Session:   Stress:    Feeling of Stress :  Social Connections:    Frequency of Communication with Friends and Family:    Frequency of Social Gatherings with Friends and Family:    Attends Religious Services:    Active Member of Clubs or Organizations:    Attends Engineer, structural:    Marital Status:   Intimate Partner Violence:    Fear of Current or Ex-Partner:    Emotionally Abused:    Physically Abused:    Sexually Abused:    Family History  Problem Relation Age of Onset   Diabetes Paternal Grandmother    Breast cancer Mother    Diabetes Paternal Aunt     OBJECTIVE:  Vitals:   09/09/19 0840 09/09/19 0841  BP: 128/88   Resp: 18   Temp: 98.1 F (36.7 C)     TempSrc: Oral   SpO2: 100%   Weight:  125 lb (56.7 kg)  Height:  5\' 4"  (1.626 m)   General appearance: AOx3 in no acute distress HEENT: NCAT.  Oropharynx clear.  Lungs: clear to auscultation bilaterally without adventitious breath sounds Heart: regular rate and rhythm.  Radial pulses 2+ symmetrical bilaterally Abdomen: soft; non-distended; no tenderness; bowel sounds present; no guarding or rebound tenderness Back: no CVA tenderness Extremities: no edema; symmetrical with no gross deformities Skin: warm and dry Neurologic: Ambulates from chair to exam table without difficulty Psychological: alert and cooperative; normal mood and affect  Labs Reviewed  POCT URINALYSIS DIP (MANUAL ENTRY) - Abnormal; Notable for the following components:      Result Value   Blood, UA trace-intact (*)    All other components within normal limits  URINE CULTURE  POCT URINE PREGNANCY  CERVICOVAGINAL ANCILLARY ONLY    ASSESSMENT & PLAN:  1. Lower abdominal pain   2. Acute bilateral low back pain without sciatica     Meds ordered this encounter  Medications   phenazopyridine (PYRIDIUM) 100 MG tablet    Sig: Take 1 tablet (100 mg total) by mouth 3 (three) times daily as needed for pain.    Dispense:  10 tablet    Refill:  0    Discharge Instructions Urine culture/cervical ancillary were sent.  We will call you with the results.   Push fluids and get plenty of rest.   Take pyridium as prescribed and as needed for symptomatic relief Follow up with PCP if symptoms persists Return here or go to ER if you have any new or worsening symptoms such as fever, worsening abdominal pain, nausea/vomiting, flank pain, etc...  Outlined signs and symptoms indicating need for more acute intervention. Patient verbalized understanding. After Visit Summary given.  Note: This document was prepared using Dragon voice recognition software and may include unintentional dictation errors.    ,  FNP 09/09/19 [phone]

## 2019-09-09 NOTE — Discharge Instructions (Addendum)
Urine culture/cervical ancillary were sent.  We will call you with the results.   Push fluids and get plenty of rest.   Take OTC Tylenol/ibuprofen as needed for back pain Take pyridium as prescribed and as needed for symptomatic relief Follow up with PCP if symptoms persists Return here or go to ER if you have any new or worsening symptoms such as fever, worsening abdominal pain, nausea/vomiting, flank pain, etc..Marland Kitchen

## 2019-09-09 NOTE — ED Triage Notes (Signed)
Lower abd and lower back pain that started this morning. Pt has heard uti's in the past.

## 2019-09-10 LAB — URINE CULTURE: Culture: NO GROWTH

## 2019-09-11 LAB — CERVICOVAGINAL ANCILLARY ONLY
Bacterial Vaginitis (gardnerella): NEGATIVE
Candida Glabrata: NEGATIVE
Candida Vaginitis: POSITIVE — AB
Chlamydia: NEGATIVE
Comment: NEGATIVE
Comment: NEGATIVE
Comment: NEGATIVE
Comment: NEGATIVE
Comment: NEGATIVE
Comment: NORMAL
Neisseria Gonorrhea: NEGATIVE
Trichomonas: NEGATIVE

## 2019-09-13 ENCOUNTER — Telehealth (HOSPITAL_COMMUNITY): Payer: Self-pay | Admitting: Emergency Medicine

## 2019-09-13 MED ORDER — FLUCONAZOLE 150 MG PO TABS: 150.0000 mg | ORAL_TABLET | Freq: Once | ORAL | 0 refills | Status: AC

## 2020-11-20 ENCOUNTER — Other Ambulatory Visit: Payer: Self-pay

## 2020-11-20 ENCOUNTER — Ambulatory Visit (INDEPENDENT_AMBULATORY_CARE_PROVIDER_SITE_OTHER): Payer: Medicaid Other | Admitting: *Deleted

## 2020-11-20 ENCOUNTER — Encounter: Payer: Self-pay | Admitting: *Deleted

## 2020-11-20 VITALS — BP 108/66 | HR 115 | Ht 64.0 in | Wt 124.5 lb

## 2020-11-20 DIAGNOSIS — Z3201 Encounter for pregnancy test, result positive: Secondary | ICD-10-CM | POA: Diagnosis not present

## 2020-11-20 LAB — POCT URINE PREGNANCY: Preg Test, Ur: POSITIVE — AB

## 2020-11-20 MED ORDER — PRENATAL PLUS 27-1 MG PO TABS: 1.0000 | ORAL_TABLET | Freq: Every day | ORAL | 3 refills | Status: DC

## 2020-11-20 MED ORDER — BONJESTA 20-20 MG PO TBCR: 1.0000 | EXTENDED_RELEASE_TABLET | Freq: Every day | ORAL | 8 refills | Status: DC

## 2020-11-20 NOTE — Addendum Note (Signed)
Addended by: Shawna Clamp R on: 11/20/2020 01:19 PM   Modules accepted: Orders

## 2020-11-20 NOTE — Progress Notes (Addendum)
   NURSE VISIT- PREGNANCY CONFIRMATION   SUBJECTIVE:  Maria Henderson is a 25 y.o. G12P2002 female at [redacted]w[redacted]d by certain LMP of Patient's last menstrual period was 09/20/2020. Here for pregnancy confirmation.  Home pregnancy test: positive x 4   She reports nausea and vomiting.  She is not taking prenatal vitamins.    OBJECTIVE:  BP 108/66 (BP Location: Right Arm, Patient Position: Sitting, Cuff Size: Normal)   Pulse (!) 115   Ht 5\' 4"  (1.626 m)   Wt 124 lb 8 oz (56.5 kg)   LMP 09/20/2020   Breastfeeding Yes   BMI 21.37 kg/m   Appears well, in no apparent distress  Results for orders placed or performed in visit on 11/20/20 (from the past 24 hour(s))  POCT urine pregnancy   Collection Time: 11/20/20 11:38 AM  Result Value Ref Range   Preg Test, Ur Positive (A) Negative    ASSESSMENT: Positive pregnancy test, [redacted]w[redacted]d by LMP    PLAN: Schedule for dating ultrasound in 2 weeks Prenatal vitamins: note routed to [facility] to send prescription   Nausea medicines: requested-note routed to [facility] to send prescription   OB packet given: Yes  [redacted]w[redacted]d  11/20/2020 11:40 AM  Chart reviewed for nurse visit. Agree with plan of care. Rx pnv and 11/22/2020, Quenten Raven 11/20/2020 1:18 PM

## 2020-12-05 ENCOUNTER — Other Ambulatory Visit: Payer: Self-pay | Admitting: Obstetrics & Gynecology

## 2020-12-05 DIAGNOSIS — O3680X0 Pregnancy with inconclusive fetal viability, not applicable or unspecified: Secondary | ICD-10-CM

## 2020-12-06 ENCOUNTER — Other Ambulatory Visit: Payer: Medicaid Other

## 2021-01-08 ENCOUNTER — Telehealth: Payer: Self-pay

## 2021-01-08 NOTE — Telephone Encounter (Signed)
Received PA request for Bonjesta. Called pharmacy and it is being rejected at the brand name level.

## 2021-01-09 ENCOUNTER — Other Ambulatory Visit: Payer: Self-pay | Admitting: Obstetrics & Gynecology

## 2021-01-09 DIAGNOSIS — O219 Vomiting of pregnancy, unspecified: Secondary | ICD-10-CM

## 2021-01-09 MED ORDER — DOXYLAMINE-PYRIDOXINE 10-10 MG PO TBEC: 10.0000 mg | DELAYED_RELEASE_TABLET | Freq: Every evening | ORAL | 3 refills | Status: DC

## 2021-01-10 NOTE — Telephone Encounter (Signed)
Called pt to explain about PA denial, phone # not valid. Mychart msg sent to pt.

## 2021-01-26 NOTE — L&D Delivery Note (Addendum)
[facility] Delivery Note  Maria Henderson is a 26 y.o. G3P2002 at [redacted]w[redacted]d admitted for IOL for A1GDM.   GBS Status: --[name] (05/09 1630) Maximum Maternal Temperature: 98.2  Labor course: Initial SVE: 2.5 cm. Augmentation with: AROM and Pitocin. She then progressed to complete.  ROM: 8h 65m with clear fluid  Birth: At [place] a viable female was delivered via spontaneous vaginal delivery (Presentation: ROA). Nuchal cord present: No.  Shoulders and body delivered in usual fashion. Infant placed directly on mom's abdomen for bonding/skin-to-skin, baby dried and stimulated. Cord clamped x 2 after 1 minute and cut by FOB.  Cord blood collected.  The placenta separated spontaneously and delivered via gentle cord traction.  Pitocin infused rapidly IV per protocol.  Fundus firm with massage.  Placenta inspected and appears to be intact with a 3 VC.  Placenta/Cord with the following complications: none .  Cord pH: n/a Sponge and instrument count were correct x2.  Intrapartum complications:  None Anesthesia:  IV sedation Episiotomy: none Lacerations:  none Suture Repair:  n/a EBL (mL): 200   Infant: APGAR (1 MIN): 9   APGAR (5 MINS): 9   Infant weight: pending  Mom to postpartum.  Baby to Couplet care / Skin to Skin. Placenta to L&D   Plans to Breast and bottlefeed Contraception: IUD at [facility] visit Circumcision: N/A  Note sent to [facility]: FT for pp visit.   Greggory Keen, SNM 06/15/2021 1:51 AM   Delivery Note documented by CNM due to the above SNM's inability to log in to Epic I was gloved and present for entire delivery Delivery was uneventful and tolerated well by patient No difficulty with shoulders Placenta delivered spontaneously and grossly intact Perineum as described above. Suturing not indicated. Agree with delivery note above.  Laury Deep, CNM 06/15/2021, 2:01 AM

## 2021-02-11 ENCOUNTER — Other Ambulatory Visit: Payer: Self-pay | Admitting: Obstetrics & Gynecology

## 2021-02-11 ENCOUNTER — Ambulatory Visit (INDEPENDENT_AMBULATORY_CARE_PROVIDER_SITE_OTHER): Payer: Medicaid Other

## 2021-02-11 ENCOUNTER — Other Ambulatory Visit: Payer: Self-pay

## 2021-02-11 DIAGNOSIS — O3680X0 Pregnancy with inconclusive fetal viability, not applicable or unspecified: Secondary | ICD-10-CM

## 2021-02-11 DIAGNOSIS — Z3A20 20 weeks gestation of pregnancy: Secondary | ICD-10-CM | POA: Diagnosis not present

## 2021-02-11 DIAGNOSIS — Z363 Encounter for antenatal screening for malformations: Secondary | ICD-10-CM

## 2021-02-11 NOTE — Progress Notes (Signed)
[place] 20+4 wks,single IUP,cephalic,anterior placenta gr 0,fhr 144 BPM,SVP of fluid 3.3 cm,cx 2.9 cm,normal ovaries,EFW 318 g 14.5% AC 5.6%,left hydronephrosis,no ureters visualized,left kidney is smaller and positioned lower than the right kidney,discussed results with Dr.Eure,pt will come back for new OB appt,LMP was changed half way through [place] reflects the new LMP,EDD 06/27/21 by LMP

## 2021-03-03 ENCOUNTER — Encounter: Payer: Medicaid Other | Admitting: Women's Health

## 2021-03-03 ENCOUNTER — Ambulatory Visit: Payer: Medicaid Other | Admitting: *Deleted

## 2021-03-17 ENCOUNTER — Other Ambulatory Visit: Payer: Self-pay | Admitting: Women's Health

## 2021-03-18 DIAGNOSIS — O0932 Supervision of pregnancy with insufficient antenatal care, second trimester: Secondary | ICD-10-CM | POA: Insufficient documentation

## 2021-03-18 DIAGNOSIS — O0993 Supervision of high risk pregnancy, unspecified, third trimester: Secondary | ICD-10-CM | POA: Insufficient documentation

## 2021-03-18 DIAGNOSIS — Z3490 Encounter for supervision of normal pregnancy, unspecified, unspecified trimester: Secondary | ICD-10-CM | POA: Insufficient documentation

## 2021-03-19 ENCOUNTER — Other Ambulatory Visit (HOSPITAL_COMMUNITY)
Admission: RE | Admit: 2021-03-19 | Discharge: 2021-03-19 | Disposition: A | Discharge status: Home / Self Care | Payer: Medicaid Other | Source: Ambulatory Visit | Attending: Advanced Practice Midwife | Admitting: Advanced Practice Midwife

## 2021-03-19 ENCOUNTER — Encounter: Payer: Self-pay | Admitting: Advanced Practice Midwife

## 2021-03-19 ENCOUNTER — Other Ambulatory Visit: Payer: Self-pay

## 2021-03-19 ENCOUNTER — Ambulatory Visit (INDEPENDENT_AMBULATORY_CARE_PROVIDER_SITE_OTHER): Payer: Medicaid Other | Admitting: Advanced Practice Midwife

## 2021-03-19 VITALS — BP 126/72 | HR 121 | Wt 133.0 lb

## 2021-03-19 DIAGNOSIS — O0932 Supervision of pregnancy with insufficient antenatal care, second trimester: Secondary | ICD-10-CM | POA: Diagnosis not present

## 2021-03-19 DIAGNOSIS — Z3482 Encounter for supervision of other normal pregnancy, second trimester: Secondary | ICD-10-CM | POA: Diagnosis not present

## 2021-03-19 DIAGNOSIS — Z113 Encounter for screening for infections with a predominantly sexual mode of transmission: Secondary | ICD-10-CM | POA: Diagnosis not present

## 2021-03-19 DIAGNOSIS — Z3A25 25 weeks gestation of pregnancy: Secondary | ICD-10-CM | POA: Diagnosis not present

## 2021-03-19 DIAGNOSIS — Z124 Encounter for screening for malignant neoplasm of cervix: Secondary | ICD-10-CM | POA: Diagnosis not present

## 2021-03-19 DIAGNOSIS — Z302 Encounter for sterilization: Secondary | ICD-10-CM

## 2021-03-19 DIAGNOSIS — Z3480 Encounter for supervision of other normal pregnancy, unspecified trimester: Secondary | ICD-10-CM

## 2021-03-19 DIAGNOSIS — O35EXX0 Maternal care for other (suspected) fetal abnormality and damage, fetal genitourinary anomalies, not applicable or unspecified: Secondary | ICD-10-CM | POA: Diagnosis not present

## 2021-03-19 LAB — POCT URINALYSIS DIPSTICK OB
Glucose, UA: NEGATIVE
Ketones, UA: NEGATIVE
Leukocytes, UA: NEGATIVE
Nitrite, UA: NEGATIVE
POC,PROTEIN,UA: NEGATIVE

## 2021-03-19 NOTE — Patient Instructions (Addendum)
Maria Henderson, I greatly value your feedback.  If you receive a survey following your visit with [place] today, we appreciate you taking the time to fill it out.  Thanks, Philipp Deputy, CNM   You will have your sugar test next visit.  Please do not eat or drink anything after midnight the night before you come, not even water.  You will be here for at least two hours.  Please make an appointment online for the bloodwork at [email or web address] for 8:30am (or as close to this as possible). Make sure you select the [facility] service center. The day of the appointment, check in with our office first, then you will go to [facility] to start the sugar test.    [facility] HAS MOVED!!! It is now [facility] ([address] [place], [place] [address]) Entrance C, located off of [address] valet parking  Go to [email or web address] to register for FREE online childbirth classes   Call the office [phone]) or go to [facility] if: You begin to have strong, frequent contractions Your water breaks.  Sometimes it is a big gush of fluid, sometimes it is just a trickle that keeps getting your panties wet or running down your legs You have vaginal bleeding.  It is normal to have a small amount of spotting if your cervix was checked.  You don't feel your baby moving like normal.  If you don't, get you something to eat and drink and lay down and focus on feeling your baby move.   If your baby is still not moving like normal, you should call the office or go to [facility].  [place] Pediatricians/Family Doctors: [facility] [phone]           [facility] [phone]                [facility] [phone] (usually not accepting new patients unless you have family there already, you are always welcome to call and ask)      [facility] Department [phone]       [facility] Pediatricians/Family Doctors:  [facility]: [phone] [facility]: [phone] [facility]: [phone]  [facility] Doctors:  [facility]: [phone]  [facility]: [phone]  [facility] Doctors: [facility]: [phone]   Home Blood Pressure Monitoring for Patients   Your provider has recommended that you check your blood pressure (BP) at least once a week at home. If you do not have a blood pressure cuff at home, one will be provided for you. Contact your provider if you have not received your monitor within 1 week.   Helpful Tips for Accurate Home Blood Pressure Checks  Don't smoke, exercise, or drink caffeine 30 minutes before checking your BP Use the restroom before checking your BP (a full bladder can raise your pressure) Relax in a comfortable upright chair Feet on the ground Left arm resting comfortably on a flat surface at the level of your heart Legs uncrossed Back supported Sit quietly and don't talk Place the cuff on your bare arm Adjust snuggly, so that only two fingertips can fit between your skin and the top of the cuff Check 2 readings separated by at least one minute Keep a log of your BP readings For a visual, please reference this diagram: http://ccnc.care/bpdiagram  Provider Name: [facility]     Phone: [phone]  Zone 1: ALL CLEAR  Continue to monitor your symptoms:  BP reading is less than 140 (top number) or less than 90 (bottom number)  No right upper stomach pain No headaches or seeing spots No feeling nauseated or throwing up No swelling in face and hands  Zone 2: CAUTION Call your doctor's office for any of the following:  BP reading is greater than 140 (top number) or greater than 90 (bottom number)  Stomach pain under your ribs in the middle or right side Headaches or seeing spots Feeling nauseated or throwing up Swelling in face and hands  Zone 3: EMERGENCY  Seek  immediate medical care if you have any of the following:  BP reading is greater than160 (top number) or greater than 110 (bottom number) Severe headaches not improving with Tylenol Serious difficulty catching your breath Any worsening symptoms from Zone 2   Second Trimester of Pregnancy The second trimester is from week 13 through week 28, months 4 through 6. The second trimester is often a time when you feel your best. Your body has also adjusted to being pregnant, and you begin to feel better physically. Usually, morning sickness has lessened or quit completely, you may have more energy, and you may have an increase in appetite. The second trimester is also a time when the fetus is growing rapidly. At the end of the sixth month, the fetus is about 9 inches long and weighs about 1 pounds. You will likely begin to feel the baby move (quickening) between 18 and 20 weeks of the pregnancy. BODY CHANGES Your body goes through many changes during pregnancy. The changes vary from woman to woman.  Your weight will continue to increase. You will notice your lower abdomen bulging out. You may begin to get stretch marks on your hips, abdomen, and breasts. You may develop headaches that can be relieved by medicines approved by your health care provider. You may urinate more often because the fetus is pressing on your bladder. You may develop or continue to have heartburn as a result of your pregnancy. You may develop constipation because certain hormones are causing the muscles that push waste through your intestines to slow down. You may develop hemorrhoids or swollen, bulging veins (varicose veins). You may have back pain because of the weight gain and pregnancy hormones relaxing your joints between the bones in your pelvis and as a result of a shift in weight and the muscles that support your balance. Your breasts will continue to grow and be tender. Your gums may bleed and may be sensitive to brushing  and flossing. Dark spots or blotches (chloasma, mask of pregnancy) may develop on your face. This will likely fade after the baby is born. A dark line from your belly button to the pubic area (linea nigra) may appear. This will likely fade after the baby is born. You may have changes in your hair. These can include thickening of your hair, rapid growth, and changes in texture. Some women also have hair loss during or after pregnancy, or hair that feels dry or thin. Your hair will most likely return to normal after your baby is born. WHAT TO EXPECT AT YOUR PRENATAL VISITS During a routine prenatal visit: You will be weighed to make sure you and the fetus are growing normally. Your blood pressure will be taken. Your abdomen will be measured to track your baby's growth. The fetal heartbeat will be listened to. Any test results from the previous visit will be discussed. Your health care provider  may ask you: How you are feeling. If you are feeling the baby move. If you have had any abnormal symptoms, such as leaking fluid, bleeding, severe headaches, or abdominal cramping. If you have any questions. Other tests that may be performed during your second trimester include: Blood tests that check for: Low iron levels (anemia). Gestational diabetes (between 24 and 28 weeks). Rh antibodies. Urine tests to check for infections, diabetes, or protein in the urine. An ultrasound to confirm the proper growth and development of the baby. An amniocentesis to check for possible genetic problems. Fetal screens for spina bifida and Down syndrome. HOME CARE INSTRUCTIONS  Avoid all smoking, herbs, alcohol, and unprescribed drugs. These chemicals affect the formation and growth of the baby. Follow your health care provider's instructions regarding medicine use. There are medicines that are either safe or unsafe to take during pregnancy. Exercise only as directed by your health care provider. Experiencing  uterine cramps is a good sign to stop exercising. Continue to eat regular, healthy meals. Wear a good support bra for breast tenderness. Do not use hot tubs, steam rooms, or saunas. Wear your seat belt at all times when driving. Avoid raw meat, uncooked cheese, cat litter boxes, and soil used by cats. These carry germs that can cause birth defects in the baby. Take your prenatal vitamins. Try taking a stool softener (if your health care provider approves) if you develop constipation. Eat more high-fiber foods, such as fresh vegetables or fruit and whole grains. Drink plenty of fluids to keep your urine clear or pale yellow. Take warm sitz baths to soothe any pain or discomfort caused by hemorrhoids. Use hemorrhoid cream if your health care provider approves. If you develop varicose veins, wear support hose. Elevate your feet for 15 minutes, 3-4 times a day. Limit salt in your diet. Avoid heavy lifting, wear low heel shoes, and practice good posture. Rest with your legs elevated if you have leg cramps or low back pain. Visit your dentist if you have not gone yet during your pregnancy. Use a soft toothbrush to brush your teeth and be gentle when you floss. A sexual relationship may be continued unless your health care provider directs you otherwise. Continue to go to all your prenatal visits as directed by your health care provider. SEEK MEDICAL CARE IF:  You have dizziness. You have mild pelvic cramps, pelvic pressure, or nagging pain in the abdominal area. You have persistent nausea, vomiting, or diarrhea. You have a bad smelling vaginal discharge. You have pain with urination. SEEK IMMEDIATE MEDICAL CARE IF:  You have a fever. You are leaking fluid from your vagina. You have spotting or bleeding from your vagina. You have severe abdominal cramping or pain. You have rapid weight gain or loss. You have shortness of breath with chest pain. You notice sudden or extreme swelling of your face,  hands, ankles, feet, or legs. You have not felt your baby move in over an hour. You have severe headaches that do not go away with medicine. You have vision changes. Document Released: 01/06/2001 Document Revised: 01/17/2013 Document Reviewed: 03/15/2012 [facility] Patient Information 2015 [place], [place]. This information is not intended to replace advice given to you by your health care provider. Make sure you discuss any questions you have with your health care provider.

## 2021-03-19 NOTE — Progress Notes (Signed)
INITIAL OBSTETRICAL VISIT Patient name: Maria Henderson MRN [id number]  Date of birth: [birth year] Chief Complaint:   Initial Prenatal Visit  History of Present Illness:   Maria Henderson is a 26 y.o. G58P2002 African-American female at [redacted]w[redacted]d by LMP c/w u/s at 20.4 weeks with an Estimated Date of Delivery: 06/27/21 being seen today for her initial obstetrical visit.   Patient's last menstrual period was 09/20/2020. Her obstetrical history is significant for  SVB x 2 .   Today she reports no complaints.  Last pap 2017. Results were: NILM w/ HRHPV not done  Depression screen [facility] 2/9 03/19/2021 06/09/2018 12/24/2015 11/25/2015  Decreased Interest 0 0 1 1  Down, Depressed, Hopeless 0 0 1 1  PHQ - 2 Score 0 0 2 2  Altered sleeping 1 1 1 3   Tired, decreased energy 1 1 3 3   Change in appetite 0 0 3 2  Feeling bad or failure about yourself  1 0 0 0  Trouble concentrating 0 0 0 1  Moving slowly or fidgety/restless 0 0 0 1  Suicidal thoughts 0 0 0 0  PHQ-9 Score 3 2 9 12   Difficult doing work/chores - Not difficult at all - -     GAD 7 : Generalized Anxiety Score 03/19/2021  Nervous, Anxious, on Edge 1  Control/stop worrying 1  Worry too much - different things 1  Trouble relaxing 1  Restless 0  Easily annoyed or irritable 2  Afraid - awful might happen 0  Total GAD 7 Score 6     Review of Systems:   Pertinent items are noted in HPI Denies cramping/contractions, leakage of fluid, vaginal bleeding, abnormal vaginal discharge w/ itching/odor/irritation, headaches, visual changes, shortness of breath, chest pain, abdominal pain, severe nausea/vomiting, or problems with urination or bowel movements unless otherwise stated above.  Pertinent History Reviewed:  Reviewed past medical,surgical, social, obstetrical and family history.  Reviewed problem list, medications and allergies. OB History  Gravida Para Term Preterm AB Living  3 2 2     2   SAB IAB Ectopic Multiple Live Births         0 2    # Outcome Date GA Lbr Len/2nd Weight Sex Delivery Anes PTL Lv  3 Current           2 Term 10/22/18 [redacted]w[redacted]d  7 lb 6 oz (3.345 kg) M Vag-Spont  N LIV  1 Term 06/27/16 [redacted]w[redacted]d 11:07 / 00:58 7 lb 2.6 oz (3.25 kg) F Vag-Spont EPI, Local N LIV     Birth Comments: none   Physical Assessment:   Vitals:   03/19/21 1516  BP: 126/72  Pulse: (!) 121  Weight: 133 lb (60.3 kg)  Body mass index is 22.83 kg/m.       Physical Examination:  General appearance - well appearing, and in no distress  Mental status - alert, oriented to person, place, and time  Psych:  She has a normal mood and affect  Skin - warm and dry, normal color, no suspicious lesions noted  Chest - effort normal, all lung fields clear to auscultation bilaterally  Heart - normal rate and regular rhythm  Abdomen - soft, nontender  Extremities:  No swelling or varicosities noted  Pelvic - VULVA: normal appearing vulva with no masses, tenderness or lesions  VAGINA: normal appearing vagina with normal color and discharge, no lesions  CERVIX: normal appearing cervix without discharge or lesions, no CMT  Thin prep pap is done with  HR HPV cotesting  Chaperone: Angel Neas    TODAY'S NT (too late)  Results for orders placed or performed in visit on 03/19/21 (from the past 24 hour(s))  POC Urinalysis Dipstick OB   Collection Time: 03/19/21  3:47 PM  Result Value Ref Range   Color, UA     Clarity, UA     Glucose, UA Negative Negative   Bilirubin, UA     Ketones, UA neg    Spec Grav, UA     Blood, UA trace    pH, UA     POC,PROTEIN,UA Negative Negative, Trace, Small (1+), Moderate (2+), Large (3+), 4+   Urobilinogen, UA     Nitrite, UA neg    Leukocytes, UA Negative Negative   Appearance     Odor      Assessment & Plan:  1) Low-Risk Pregnancy G3P2002 at [redacted]w[redacted]d with an Estimated Date of Delivery: 06/27/21   2) Initial OB visit  3) SCT, urine culture q trimester  4) Fetal L hydronephrosis, 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   Initial labs obtained Continue prenatal vitamins Reviewed n/v relief measures and warning s/s to report Reviewed recommended weight gain based on pre-gravid BMI Encouraged well-balanced diet Genetic & carrier screening discussed: too late for NT/IT and AFP, requests Panorama Ultrasound discussed; fetal survey: requested CCNC completed> form faxed if has or is planning to apply for medicaid The nature of [facility] [employer] with multiple MDs and other Advanced Practice Providers was explained to patient; also emphasized that fellows, residents, and students are part of our team. Does have home bp cuff. Office bp cuff given: no. Rx sent: n/a. Check bp weekly, let us know if consistently >140/90.   No indications for ASA therapy (per uptodate)  Follow-up: Return in about 2 weeks (around 04/02/2021) for 2-3 wk LROB, PN2; f/u u/s kidneys; sign BTL papers.   Orders Placed This Encounter  Procedures   Urine Culture   US OB Follow Up   Genetic Screening   CBC/D/Plt+RPR+Rh+ABO+RubIgG...   Glucose Tolerance, 2 Hours w/1 Hour   POC Urinalysis Dipstick OB    Arabella Merles [facility] 03/19/2021 4:17 PM

## 2021-03-20 DIAGNOSIS — Z302 Encounter for sterilization: Secondary | ICD-10-CM | POA: Insufficient documentation

## 2021-03-20 DIAGNOSIS — O35EXX0 Maternal care for other (suspected) fetal abnormality and damage, fetal genitourinary anomalies, not applicable or unspecified: Secondary | ICD-10-CM | POA: Insufficient documentation

## 2021-03-22 LAB — URINE CULTURE

## 2021-03-24 LAB — CYTOLOGY - PAP
Chlamydia: NEGATIVE
Comment: NEGATIVE
Comment: NEGATIVE
Comment: NORMAL
Diagnosis: NEGATIVE
High risk HPV: NEGATIVE
Neisseria Gonorrhea: NEGATIVE

## 2021-04-08 ENCOUNTER — Ambulatory Visit (INDEPENDENT_AMBULATORY_CARE_PROVIDER_SITE_OTHER): Payer: Medicaid Other | Admitting: Obstetrics & Gynecology

## 2021-04-08 ENCOUNTER — Other Ambulatory Visit: Payer: Self-pay

## 2021-04-08 ENCOUNTER — Ambulatory Visit (INDEPENDENT_AMBULATORY_CARE_PROVIDER_SITE_OTHER): Payer: Medicaid Other

## 2021-04-08 ENCOUNTER — Other Ambulatory Visit: Payer: Self-pay | Admitting: Advanced Practice Midwife

## 2021-04-08 VITALS — BP 112/67 | HR 101 | Wt 134.0 lb

## 2021-04-08 DIAGNOSIS — Z3A28 28 weeks gestation of pregnancy: Secondary | ICD-10-CM

## 2021-04-08 DIAGNOSIS — O35EXX0 Maternal care for other (suspected) fetal abnormality and damage, fetal genitourinary anomalies, not applicable or unspecified: Secondary | ICD-10-CM | POA: Diagnosis not present

## 2021-04-08 DIAGNOSIS — Q614 Renal dysplasia: Secondary | ICD-10-CM

## 2021-04-08 DIAGNOSIS — O0932 Supervision of pregnancy with insufficient antenatal care, second trimester: Secondary | ICD-10-CM

## 2021-04-08 DIAGNOSIS — O365920 Maternal care for other known or suspected poor fetal growth, second trimester, not applicable or unspecified: Secondary | ICD-10-CM

## 2021-04-08 DIAGNOSIS — Z3480 Encounter for supervision of other normal pregnancy, unspecified trimester: Secondary | ICD-10-CM

## 2021-04-08 DIAGNOSIS — Z302 Encounter for sterilization: Secondary | ICD-10-CM

## 2021-04-08 NOTE — Progress Notes (Signed)
? ?  LOW-RISK PREGNANCY VISIT ?Patient name: Maria Henderson MRN [id number]  Date of birth: [birth year] ?Chief Complaint:   ?Routine Prenatal Visit ? ?History of Present Illness:   ?Maria Henderson is a 26 y.o. G51P2002 female at [redacted]w[redacted]d with an Estimated Date of Delivery: 06/27/21 being seen today for ongoing management of a low-risk pregnancy.  ?Depression screen [facility] 2/9 03/19/2021 06/09/2018 12/24/2015 11/25/2015  ?Decreased Interest 0 0 1 1  ?Down, Depressed, Hopeless 0 0 1 1  ?PHQ - 2 Score 0 0 2 2  ?Altered sleeping 1 1 1 3   ?Tired, decreased energy 1 1 3 3   ?Change in appetite 0 0 3 2  ?Feeling bad or failure about yourself  1 0 0 0  ?Trouble concentrating 0 0 0 1  ?Moving slowly or fidgety/restless 0 0 0 1  ?Suicidal thoughts 0 0 0 0  ?PHQ-9 Score 3 2 9 12   ?Difficult doing work/chores - Not difficult at all - -  ? ? ?Today she reports no complaints. Contractions: Not present. Vag. Bleeding: None.  Movement: Present. denies leaking of fluid. ?Review of Systems:   ?Pertinent items are noted in HPI ?Denies abnormal vaginal discharge w/ itching/odor/irritation, headaches, visual changes, shortness of breath, chest pain, abdominal pain, severe nausea/vomiting, or problems with urination or bowel movements unless otherwise stated above. ?Pertinent History Reviewed:  ?Reviewed past medical,surgical, social, obstetrical and family history.  ?Reviewed problem list, medications and allergies. ?Physical Assessment:  ? ?Vitals:  ? 04/08/21 1420  ?BP: 112/67  ?Pulse: (!) 101  ?Weight: 134 lb (60.8 kg)  ?Body mass index is 23 kg/m?. ?  ?     Physical Examination:  ? General appearance: Well appearing, and in no distress ? Mental status: Alert, oriented to person, place, and time ? Skin: Warm & dry ? Cardiovascular: Normal heart rate noted ? Respiratory: Normal respiratory effort, no distress ? Abdomen: Soft, gravid, nontender ? Pelvic: Cervical exam deferred        ? Extremities: Edema: None ? ?Fetal Status:     Movement: Present    ? ?Chaperone: n/a   ? ?No results found for this or any previous visit (from the past 24 hour(s)).  ?Assessment & Plan:  ?1) Low-risk pregnancy G3P2002 at [redacted]w[redacted]d with an Estimated Date of Delivery: 06/27/21  ? ?2) Fetal Left MCDK with otherwise normal findings,  ?  ?Meds: No orders of the defined types were placed in this encounter. ? ?Labs/procedures today: sonogram ? ?Plan:  Continue routine obstetrical care  ?Next visit: prefers in person   ? ?Reviewed: Preterm labor symptoms and general obstetric precautions including but not limited to vaginal bleeding, contractions, leaking of fluid and fetal movement were reviewed in detail with the patient.  All questions were answered. Has home bp cuff. Rx faxed to . Check bp weekly, let 07-18-1998 know if >140/90.  ? ?Follow-up: Return in about 3 weeks (around 04/29/2021) for LROB. ? ?No orders of the defined types were placed in this encounter. ? ? ?08/27/21, MD ?04/08/2021 ?2:52 PM ? ?

## 2021-04-08 NOTE — Progress Notes (Signed)
[place] 28+4 wks,cephalic,cx 2.7 cm,anterior placenta gr 0,normal ovaries,AFI 13 cm,multicystic left kidney,largest cyst 2.1 x 2.2 x 2.2 cm,normal right kidney,FHR 133 BPM,RI .74,.76,.78,.83=85%,BPP 8/8,efw 1184 g 24%,AC 9.6% ?

## 2021-04-11 ENCOUNTER — Other Ambulatory Visit: Payer: Medicaid Other

## 2021-04-11 ENCOUNTER — Other Ambulatory Visit: Payer: Self-pay

## 2021-04-11 DIAGNOSIS — Z3A25 25 weeks gestation of pregnancy: Secondary | ICD-10-CM | POA: Diagnosis not present

## 2021-04-11 DIAGNOSIS — Z3480 Encounter for supervision of other normal pregnancy, unspecified trimester: Secondary | ICD-10-CM | POA: Diagnosis not present

## 2021-04-15 ENCOUNTER — Encounter: Payer: Self-pay | Admitting: Advanced Practice Midwife

## 2021-04-15 ENCOUNTER — Other Ambulatory Visit: Payer: Self-pay | Admitting: *Deleted

## 2021-04-15 DIAGNOSIS — O0993 Supervision of high risk pregnancy, unspecified, third trimester: Secondary | ICD-10-CM

## 2021-04-15 DIAGNOSIS — O24410 Gestational diabetes mellitus in pregnancy, diet controlled: Secondary | ICD-10-CM | POA: Insufficient documentation

## 2021-04-15 LAB — CBC/D/PLT+RPR+RH+ABO+RUBIGG...
Antibody Screen: NEGATIVE
Basophils Absolute: 0 10*3/uL (ref 0.0–0.2)
Basos: 0 %
EOS (ABSOLUTE): 0.1 10*3/uL (ref 0.0–0.4)
Eos: 1 %
HCV Ab: NONREACTIVE
HIV Screen 4th Generation wRfx: NONREACTIVE
Hematocrit: 33.9 % — ABNORMAL LOW (ref 34.0–46.6)
Hemoglobin: 11.8 g/dL (ref 11.1–15.9)
Hepatitis B Surface Ag: NEGATIVE
Immature Grans (Abs): 0 10*3/uL (ref 0.0–0.1)
Immature Granulocytes: 0 %
Lymphocytes Absolute: 1.5 10*3/uL (ref 0.7–3.1)
Lymphs: 21 %
MCH: 30.6 pg (ref 26.6–33.0)
MCHC: 34.8 g/dL (ref 31.5–35.7)
MCV: 88 fL (ref 79–97)
Monocytes Absolute: 0.6 10*3/uL (ref 0.1–0.9)
Monocytes: 8 %
Neutrophils Absolute: 4.8 10*3/uL (ref 1.4–7.0)
Neutrophils: 70 %
Platelets: 308 10*3/uL (ref 150–450)
RBC: 3.85 x10E6/uL (ref 3.77–5.28)
RDW: 13.3 % (ref 11.7–15.4)
RPR Ser Ql: NONREACTIVE
Rh Factor: POSITIVE
Rubella Antibodies, IGG: 10.1 index (ref >0.99)
WBC: 6.9 10*3/uL (ref 3.4–10.8)

## 2021-04-15 LAB — GLUCOSE TOLERANCE, 2 HOURS W/ 1HR
Glucose, 1 hour: 153 mg/dL (ref 70–179)
Glucose, 2 hour: 158 mg/dL — ABNORMAL HIGH (ref 70–152)
Glucose, Fasting: 93 mg/dL — ABNORMAL HIGH (ref 70–91)

## 2021-04-15 LAB — HCV INTERPRETATION

## 2021-04-15 MED ORDER — ACCU-CHEK SOFTCLIX LANCETS MISC: 12 refills | Status: DC

## 2021-04-15 MED ORDER — ACCU-CHEK GUIDE VI STRP: ORAL_STRIP | 12 refills | Status: DC

## 2021-04-15 MED ORDER — ACCU-CHEK GUIDE ME W/DEVICE KIT: 1.0000 | PACK | Freq: Four times a day (QID) | 0 refills | Status: DC

## 2021-04-23 ENCOUNTER — Ambulatory Visit: Payer: Medicaid Other | Admitting: Registered"

## 2021-04-29 ENCOUNTER — Ambulatory Visit (INDEPENDENT_AMBULATORY_CARE_PROVIDER_SITE_OTHER): Payer: Medicaid Other | Admitting: Obstetrics & Gynecology

## 2021-04-29 ENCOUNTER — Encounter: Payer: Self-pay | Admitting: Obstetrics & Gynecology

## 2021-04-29 VITALS — BP 102/67 | HR 93 | Wt 134.0 lb

## 2021-04-29 DIAGNOSIS — O09893 Supervision of other high risk pregnancies, third trimester: Secondary | ICD-10-CM

## 2021-04-29 DIAGNOSIS — O24410 Gestational diabetes mellitus in pregnancy, diet controlled: Secondary | ICD-10-CM

## 2021-04-29 DIAGNOSIS — Z3A31 31 weeks gestation of pregnancy: Secondary | ICD-10-CM

## 2021-04-29 DIAGNOSIS — Q614 Renal dysplasia: Secondary | ICD-10-CM

## 2021-04-29 LAB — POCT URINALYSIS DIPSTICK OB
Blood, UA: NEGATIVE
Glucose, UA: NEGATIVE
Ketones, UA: NEGATIVE
Leukocytes, UA: NEGATIVE
Nitrite, UA: NEGATIVE
POC,PROTEIN,UA: NEGATIVE

## 2021-04-29 NOTE — Progress Notes (Signed)
? ? ?HIGH-RISK PREGNANCY VISIT ?Patient name: Maria Henderson MRN [id number]  Date of birth: 01-17-1996 ?Chief Complaint:   ?Routine Prenatal Visit ? ?History of Present Illness:   ?Maria Henderson is a 26 y.o. G1P2002 female at [redacted]w[redacted]d with an Estimated Date of Delivery: 06/27/21 being seen today for ongoing management of a high-risk pregnancy complicated by diabetes mellitus A1DM.   ? ?Today she reports no complaints. Contractions: Not present. Vag. Bleeding: None.  Movement: Present. denies leaking of fluid.  ? ? ?  03/19/2021  ?  3:32 PM 06/09/2018  ?  2:14 PM 12/24/2015  ?  2:59 PM 11/25/2015  ? 10:49 AM  ?Depression screen PHQ 2/9  ?Decreased Interest 0 0 1 1  ?Down, Depressed, Hopeless 0 0 1 1  ?PHQ - 2 Score 0 0 2 2  ?Altered sleeping 1 1 1 3   ?Tired, decreased energy 1 1 3 3   ?Change in appetite 0 0 3 2  ?Feeling bad or failure about yourself  1 0 0 0  ?Trouble concentrating 0 0 0 1  ?Moving slowly or fidgety/restless 0 0 0 1  ?Suicidal thoughts 0 0 0 0  ?PHQ-9 Score 3 2 9 12   ?Difficult doing work/chores  Not difficult at all    ? ?  ? ?  03/19/2021  ?  3:32 PM  ?GAD 7 : Generalized Anxiety Score  ?Nervous, Anxious, on Edge 1  ?Control/stop worrying 1  ?Worry too much - different things 1  ?Trouble relaxing 1  ?Restless 0  ?Easily annoyed or irritable 2  ?Afraid - awful might happen 0  ?Total GAD 7 Score 6  ? ? ? ?Review of Systems:   ?Pertinent items are noted in HPI ?Denies abnormal vaginal discharge w/ itching/odor/irritation, headaches, visual changes, shortness of breath, chest pain, abdominal pain, severe nausea/vomiting, or problems with urination or bowel movements unless otherwise stated above. ?Pertinent History Reviewed:  ?Reviewed past medical,surgical, social, obstetrical and family history.  ?Reviewed problem list, medications and allergies. ?Physical Assessment:  ? ?Vitals:  ? 04/29/21 1112  ?BP: 102/67  ?Pulse: 93  ?Weight: 134 lb (60.8 kg)  ?Body mass index is 23 kg/m?. ?     ?     Physical  Examination:  ? General appearance: alert, well appearing, and in no distress ? Mental status: alert, oriented to person, place, and time ? Skin: warm & dry  ? Extremities: Edema: None  ?  Cardiovascular: normal heart rate noted ? Respiratory: normal respiratory effort, no distress ? Abdomen: gravid, soft, non-tender ? Pelvic: Cervical exam deferred        ? ?Fetal Status: Fetal Heart Rate (bpm): 142 Fundal Height: 28 cm Movement: Present   ? ?Fetal Surveillance Testing today: FHR 142  ? ?Chaperone: N/A   ? ?Results for orders placed or performed in visit on 04/29/21 (from the past 24 hour(s))  ?POC Urinalysis Dipstick OB  ? Collection Time: 04/29/21 11:18 AM  ?Result Value Ref Range  ? Color, UA    ? Clarity, UA    ? Glucose, UA Negative Negative  ? Bilirubin, UA    ? Ketones, UA neg   ? Spec Grav, UA    ? Blood, UA neg   ? pH, UA    ? POC,PROTEIN,UA Negative Negative, Trace, Small (1+), Moderate (2+), Large (3+), 4+  ? Urobilinogen, UA    ? Nitrite, UA neg   ? Leukocytes, UA Negative Negative  ? Appearance    ? Odor    ?  ?  Assessment & Plan:  ?High-risk pregnancy: G3P2002 at [redacted]w[redacted]d with an Estimated Date of Delivery: 06/27/21  ? ? ?  ICD-10-CM   ?1. Supervision of other high risk pregnancies, third trimester  O09.893 POC Urinalysis Dipstick OB  ?  ?2. [redacted] weeks gestation of pregnancy  Z3A.31 POC Urinalysis Dipstick OB  ?  ?3. Gestational diabetes mellitus, class A1  O24.410   ? getting started on her diet and CBG.  Discussed her hs snack as it relates to her am fasting. recommendations made  ?  ?4. 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 pt aware, all questions answered  ?  ?  ? ? ? ?Meds: No orders of the defined types were placed in this encounter. ? ? ?Orders:  ?Orders Placed This Encounter  ?Procedures  ? POC Urinalysis Dipstick OB  ?  ? ?Labs/procedures today: none ? ?Treatment Plan:  per protocol ? ?Reviewed: Preterm labor symptoms and general obstetric precautions including but not limited to vaginal  bleeding, contractions, leaking of fluid and fetal movement were reviewed in detail with the patient.  All questions were answered. Does have home bp cuff. Office bp cuff given: not applicable. Check bp weekly, let us know if consistently >150 and/or >95. ? ?Follow-up: Return in about 2 weeks (around 05/13/2021) for [place]. ? ? ?No future appointments. ? ?Orders Placed This Encounter  ?Procedures  ? POC Urinalysis Dipstick OB  ? ?[place]  ?Attending Physician for the [facility] ?[facility] ?04/29/2021 ?12:08 PM ? ?

## 2021-05-13 ENCOUNTER — Ambulatory Visit (INDEPENDENT_AMBULATORY_CARE_PROVIDER_SITE_OTHER): Payer: Medicaid Other | Admitting: Advanced Practice Midwife

## 2021-05-13 VITALS — BP 111/73 | HR 120 | Wt 134.0 lb

## 2021-05-13 DIAGNOSIS — O0993 Supervision of high risk pregnancy, unspecified, third trimester: Secondary | ICD-10-CM

## 2021-05-13 DIAGNOSIS — Z3A33 33 weeks gestation of pregnancy: Secondary | ICD-10-CM

## 2021-05-13 DIAGNOSIS — D573 Sickle-cell trait: Secondary | ICD-10-CM | POA: Diagnosis not present

## 2021-05-13 DIAGNOSIS — O365990 Maternal care for other known or suspected poor fetal growth, unspecified trimester, not applicable or unspecified: Secondary | ICD-10-CM

## 2021-05-13 LAB — POCT URINALYSIS DIPSTICK OB
Glucose, UA: NEGATIVE
Ketones, UA: NEGATIVE
Nitrite, UA: NEGATIVE
POC,PROTEIN,UA: NEGATIVE

## 2021-05-13 NOTE — Progress Notes (Signed)
? ?HIGH-RISK PREGNANCY VISIT ?Patient name: Maria Henderson MRN [id number]  Date of birth: May 10, 1995 ?Chief Complaint:   ?Routine Prenatal Visit ? ?History of Present Illness:   ?Maria Henderson is a 26 y.o. G65P2002 female at [redacted]w[redacted]d with an Estimated Date of Delivery: 06/27/21 being seen today for ongoing management of a high-risk pregnancy complicated by diabetes mellitus A1DM.   ? ?Today she reports no complaints. Contractions: Irritability. Vag. Bleeding: None.  Movement: Present. denies leaking of fluid.  ? ? ?  03/19/2021  ?  3:32 PM 06/09/2018  ?  2:14 PM 12/24/2015  ?  2:59 PM 11/25/2015  ? 10:49 AM  ?Depression screen PHQ 2/9  ?Decreased Interest 0 0 1 1  ?Down, Depressed, Hopeless 0 0 1 1  ?PHQ - 2 Score 0 0 2 2  ?Altered sleeping 1 1 1 3   ?Tired, decreased energy 1 1 3 3   ?Change in appetite 0 0 3 2  ?Feeling bad or failure about yourself  1 0 0 0  ?Trouble concentrating 0 0 0 1  ?Moving slowly or fidgety/restless 0 0 0 1  ?Suicidal thoughts 0 0 0 0  ?PHQ-9 Score 3 2 9 12   ?Difficult doing work/chores  Not difficult at all    ? ?  ? ?  03/19/2021  ?  3:32 PM  ?GAD 7 : Generalized Anxiety Score  ?Nervous, Anxious, on Edge 1  ?Control/stop worrying 1  ?Worry too much - different things 1  ?Trouble relaxing 1  ?Restless 0  ?Easily annoyed or irritable 2  ?Afraid - awful might happen 0  ?Total GAD 7 Score 6  ? ? ? ?Review of Systems:   ?Pertinent items are noted in HPI ?Denies abnormal vaginal discharge w/ itching/odor/irritation, headaches, visual changes, shortness of breath, chest pain, abdominal pain, severe nausea/vomiting, or problems with urination or bowel movements unless otherwise stated above. ?Pertinent History Reviewed:  ?Reviewed past medical,surgical, social, obstetrical and family history.  ?Reviewed problem list, medications and allergies. ?Physical Assessment:  ? ?Vitals:  ? 05/13/21 1345  ?BP: 111/73  ?Pulse: (!) 120  ?Weight: 134 lb (60.8 kg)  ?Body mass index is 23 kg/m?. ?     ?     Physical  Examination:  ? General appearance: alert, well appearing, and in no distress ? Mental status: alert, oriented to person, place, and time ? Skin: warm & dry  ? Extremities: Edema: Trace  ?  Cardiovascular: normal heart rate noted ? Respiratory: normal respiratory effort, no distress ? Abdomen: gravid, soft, non-tender ? Pelvic: Cervical exam deferred        ? ?Fetal Status: Fetal Heart Rate (bpm): 160 Fundal Height: 32 cm Movement: Present   ? ?Fetal Surveillance Testing today: doppler  ? ? ?Results for orders placed or performed in visit on 05/13/21 (from the past 24 hour(s))  ?POC Urinalysis Dipstick OB  ? Collection Time: 05/13/21  1:50 PM  ?Result Value Ref Range  ? Color, UA    ? Clarity, UA    ? Glucose, UA Negative Negative  ? Bilirubin, UA    ? Ketones, UA neg   ? Spec Grav, UA    ? Blood, UA small   ? pH, UA    ? POC,PROTEIN,UA Negative Negative, Trace, Small (1+), Moderate (2+), Large (3+), 4+  ? Urobilinogen, UA    ? Nitrite, UA neg   ? Leukocytes, UA Small (1+) (A) Negative  ? Appearance    ? Odor    ?  ?  Assessment & Plan:  ?High-risk pregnancy: G3P2002 at [redacted]w[redacted]d with an Estimated Date of Delivery: 06/27/21  ? ?1) SCT, will get 3rd tri urine culture today; also w sm blood in UA but she is asymptomatic ? ?2) GDMA1, didn't bring log or get DM education; fasting values <96 per her report and 2hr PPs 120; encouraged to bring qid log to each visit; msg to [facility] to reschedule DM ed  ? ?3) FGR by [facility] 9.6% @ 28wks, will start weekly BPP/dopplers this week if possible with growth on 36wk scan ? ?Meds: No orders of the defined types were placed in this encounter. ? ? ?Labs/procedures today: urine culture ? ?Treatment Plan:  start weekly BPP/dopplers; growth with 36wk scan ? ?Reviewed: Preterm labor symptoms and general obstetric precautions including but not limited to vaginal bleeding, contractions, leaking of fluid and fetal movement were reviewed in detail with the patient.  All questions were answered. Does have  home bp cuff. Office bp cuff given: not applicable. Check bp weekly, let us know if consistently >140 and/or >90. ? ?Follow-up: Return for weekly BPP/dopplers starting this wk if possible; weekly HROB; growth w 36wk u/s. ? ? ?Future Appointments  ?Date Time Provider [place]  ?05/20/2021  9:15 AM [facility] [place] [facility] None  ?05/20/2021 10:10 AM Roma Schanz, CNM [facility]  ?05/27/2021  8:30 AM [facility] [place] [facility] None  ?05/27/2021  9:30 AM Roma Schanz, CNM [facility]  ?06/03/2021  2:15 PM [place] - [facility] [place] [facility] None  ?06/03/2021  3:10 PM Florian Buff, MD [facility]  ?06/11/2021  2:15 PM [place] - [facility] [place] [facility] None  ?06/11/2021  3:10 PM Janyth Pupa, DO [facility]  ?06/17/2021  1:30 PM [facility] [place] [facility] None  ?06/17/2021  2:30 PM Roma Schanz, CNM [facility]  ? ? ?Orders Placed This Encounter  ?Procedures  ? Urine Culture  ? POC Urinalysis Dipstick OB  ? ?Myrtis Ser CNM ?05/13/2021 ?3:18 PM  ?

## 2021-05-13 NOTE — Patient Instructions (Signed)
Maria Henderson, thank you for choosing our office today! We appreciate the opportunity to meet your healthcare needs. You may receive a short survey by mail, e-mail, or through [employer]. If you are happy with your care we would appreciate if you could take just a few minutes to complete the survey questions. We read all of your comments and take your feedback very seriously. Thank you again for choosing our office.  ?Center for [employer] Team at [facility] ? ?[facility] at [facility] ?([address] [place], [place] [address]) ?Entrance C, located off of [address] ?Free 24/7 valet parking  ? ?CLASSES: Go to [email or web address] to register for classes (childbirth, breastfeeding, waterbirth, infant CPR, daddy bootcamp, etc.) ? ?Call the office [phone]) or go to [facility] if: ?You begin to have strong, frequent contractions ?Your water breaks.  Sometimes it is a big gush of fluid, sometimes it is just a trickle that keeps getting your panties wet or running down your legs ?You have vaginal bleeding.  It is normal to have a small amount of spotting if your cervix was checked.  ?You don't feel your baby moving like normal.  If you don't, get you something to eat and drink and lay down and focus on feeling your baby move.   If your baby is still not moving like normal, you should call the office or go to [facility]. ? ?Call the office [phone]) or go to [facility] hospital for these signs of pre-eclampsia: ?Severe headache that does not go away with Tylenol ?Visual changes- seeing spots, double, blurred vision ?Pain under your right breast or upper abdomen that does not go away with Tums or heartburn medicine ?Nausea and/or vomiting ?Severe swelling in your hands, feet, and face  ? ?Tdap Vaccine ?It is recommended that you get the Tdap vaccine during the third trimester of EACH pregnancy to help protect your baby from getting pertussis (whooping cough) ?27-36 weeks is the BEST time to do  this so that you can pass the protection on to your baby. During pregnancy is better than after pregnancy, but if you are unable to get it during pregnancy it will be offered at the hospital.  ?You can get this vaccine with [place], at the health department, your family doctor, or some local pharmacies ?Everyone who will be around your baby should also be up-to-date on their vaccines before the baby comes. Adults (who are not pregnant) only need 1 dose of Tdap during adulthood.  ? ?[place] Pediatricians/Family Doctors ?[facility]): [address], [phone]           ?[facility]: [address], [phone]                ?[facility]): [address], [phone] (call to ask if accepting patients) ?[facility] Department: [address], [place], [phone]   ? ?[place] Pediatricians/Family Doctors ?[facility]): [address], [phone] ?[facility]: [address] [place], [phone] ?[facility] [place]: [address], [phone] ? ?[employer] Family Doctors  ?[facility]): [phone] ?[facility]: [address], [phone]  ? ?[facility] Family Doctors ?[facility]: [address], [phone] ? ?[facility]  ?[facility]: [phone], [phone] ? ?Home Blood Pressure Monitoring for Patients  ? ?Your provider has recommended that you check your  blood pressure (BP) at least once a week at home. If you do not have a blood pressure cuff at home, one will be provided for you. Contact your provider if you have not received your monitor within 1 week.  ? ?Helpful Tips for Accurate Home Blood Pressure Checks  ?Don't smoke, exercise, or drink caffeine 30 minutes before checking your BP ?Use the restroom before checking your BP (a full bladder can raise your  pressure) ?Relax in a comfortable upright chair ?Feet on the ground ?Left arm resting comfortably on a flat surface at the level of your heart ?Legs uncrossed ?Back supported ?Sit quietly and don't talk ?Place the cuff on your bare arm ?Adjust snuggly, so that only two fingertips can fit between your skin and the top of the cuff ?Check 2 readings separated by at least one minute ?Keep a log of your BP readings ?For a visual, please reference this diagram: http://ccnc.care/bpdiagram ? ?Provider Name: [facility]     Phone: [phone] ? ?Zone 1: ALL CLEAR  ?Continue to monitor your symptoms:  ?BP reading is less than 140 (top number) or less than 90 (bottom number)  ?No right upper stomach pain ?No headaches or seeing spots ?No feeling nauseated or throwing up ?No swelling in face and hands ? ?Zone 2: CAUTION ?Call your doctor's office for any of the following:  ?BP reading is greater than 140 (top number) or greater than 90 (bottom number)  ?Stomach pain under your ribs in the middle or right side ?Headaches or seeing spots ?Feeling nauseated or throwing up ?Swelling in face and hands ? ?Zone 3: EMERGENCY  ?Seek immediate medical care if you have any of the following:  ?BP reading is greater than160 (top number) or greater than 110 (bottom number) ?Severe headaches not improving with Tylenol ?Serious difficulty catching your breath ?Any worsening symptoms from Zone 2  ? ?Third Trimester of Pregnancy ?The third trimester is from week 29 through week 42, months 7 through 9. The third trimester is a time when the fetus is growing rapidly. At the end of the ninth month, the fetus is about 20 inches in length and weighs 6-10 pounds.  ?BODY CHANGES ?Your body goes through many changes during pregnancy. The changes vary from woman to woman.  ?Your weight will continue to increase. You can expect to gain 25-35 pounds (11-16 kg) by the end of the pregnancy. ?You may begin to get stretch marks on your hips, abdomen,  and breasts. ?You may urinate more often because the fetus is moving lower into your pelvis and pressing on your bladder. ?You may develop or continue to have heartburn as a result of your pregnancy. ?You may develop constipation because certain hormones are causing the muscles that push waste through your intestines to slow down. ?You may develop hemorrhoids or swollen, bulging veins (varicose veins). ?You may have pelvic pain because of the weight gain and pregnancy hormones relaxing your joints between the bones in your pelvis. Backaches may result from overexertion of the muscles supporting your posture. ?You may have changes in your hair. These can include thickening of your hair, rapid growth, and changes in texture. Some women also have hair loss during or after pregnancy, or hair that feels dry or thin. Your hair will most likely return to normal after your baby is born. ?Your breasts will continue to grow and be tender. A yellow discharge may leak from your breasts called colostrum. ?Your belly button may stick out. ?You may  feel short of breath because of your expanding uterus. ?You may notice the fetus "dropping," or moving lower in your abdomen. ?You may have a bloody mucus discharge. This usually occurs a few days to a week before labor begins. ?Your cervix becomes thin and soft (effaced) near your due date. ?WHAT TO EXPECT AT [place]  ?You will have prenatal exams every 2 weeks until week 36. Then, you will have weekly prenatal exams. During a routine prenatal visit: ?You will be weighed to make sure you and the fetus are growing normally. ?Your blood pressure is taken. ?Your abdomen will be measured to track your baby's growth. ?The fetal heartbeat will be listened to. ?Any test results from the previous visit will be discussed. ?You may have a cervical check near your due date to see if you have effaced. ?At around 36 weeks, your caregiver will check your cervix. At the same time, your  caregiver will also perform a test on the secretions of the vaginal tissue. This test is to determine if a type of bacteria, Group B streptococcus, is present. Your caregiver will explain this further. ?You

## 2021-05-15 LAB — URINE CULTURE

## 2021-05-19 ENCOUNTER — Other Ambulatory Visit: Payer: Self-pay | Admitting: Advanced Practice Midwife

## 2021-05-19 DIAGNOSIS — Q614 Renal dysplasia: Secondary | ICD-10-CM

## 2021-05-19 DIAGNOSIS — O24410 Gestational diabetes mellitus in pregnancy, diet controlled: Secondary | ICD-10-CM

## 2021-05-20 ENCOUNTER — Ambulatory Visit (INDEPENDENT_AMBULATORY_CARE_PROVIDER_SITE_OTHER): Payer: Medicaid Other | Admitting: Obstetrics & Gynecology

## 2021-05-20 ENCOUNTER — Ambulatory Visit (INDEPENDENT_AMBULATORY_CARE_PROVIDER_SITE_OTHER): Payer: Medicaid Other

## 2021-05-20 VITALS — BP 113/63 | HR 91 | Wt 134.0 lb

## 2021-05-20 DIAGNOSIS — O24410 Gestational diabetes mellitus in pregnancy, diet controlled: Secondary | ICD-10-CM | POA: Diagnosis not present

## 2021-05-20 DIAGNOSIS — O0932 Supervision of pregnancy with insufficient antenatal care, second trimester: Secondary | ICD-10-CM

## 2021-05-20 DIAGNOSIS — Q614 Renal dysplasia: Secondary | ICD-10-CM

## 2021-05-20 DIAGNOSIS — O365990 Maternal care for other known or suspected poor fetal growth, unspecified trimester, not applicable or unspecified: Secondary | ICD-10-CM

## 2021-05-20 DIAGNOSIS — O0993 Supervision of high risk pregnancy, unspecified, third trimester: Secondary | ICD-10-CM

## 2021-05-20 DIAGNOSIS — Z3A34 34 weeks gestation of pregnancy: Secondary | ICD-10-CM

## 2021-05-20 DIAGNOSIS — Z302 Encounter for sterilization: Secondary | ICD-10-CM

## 2021-05-20 NOTE — Progress Notes (Signed)
? ? ?HIGH-RISK PREGNANCY VISIT ?Patient name: Maria Henderson MRN [id number]  Date of birth: January 03, 1996 ?Chief Complaint:   ?Routine Prenatal Visit (BPP ) ? ?History of Present Illness:   ?Maria Henderson is a 26 y.o. G65P2002 female at [redacted]w[redacted]d with an Estimated Date of Delivery: 06/27/21 being seen today for ongoing management of a high-risk pregnancy complicated by FGR, [id number].   ? ?Today she reports no complaints. Contractions: Irritability. Vag. Bleeding: None.  Movement: Present. denies leaking of fluid.  ? ? ?  03/19/2021  ?  3:32 PM 06/09/2018  ?  2:14 PM 12/24/2015  ?  2:59 PM 11/25/2015  ? 10:49 AM  ?Depression screen PHQ 2/9  ?Decreased Interest 0 0 1 1  ?Down, Depressed, Hopeless 0 0 1 1  ?PHQ - 2 Score 0 0 2 2  ?Altered sleeping 1 1 1 3   ?Tired, decreased energy 1 1 3 3   ?Change in appetite 0 0 3 2  ?Feeling bad or failure about yourself  1 0 0 0  ?Trouble concentrating 0 0 0 1  ?Moving slowly or fidgety/restless 0 0 0 1  ?Suicidal thoughts 0 0 0 0  ?PHQ-9 Score 3 2 9 12   ?Difficult doing work/chores  Not difficult at all    ? ?  ? ?  03/19/2021  ?  3:32 PM  ?GAD 7 : Generalized Anxiety Score  ?Nervous, Anxious, on Edge 1  ?Control/stop worrying 1  ?Worry too much - different things 1  ?Trouble relaxing 1  ?Restless 0  ?Easily annoyed or irritable 2  ?Afraid - awful might happen 0  ?Total GAD 7 Score 6  ? ? ? ?Review of Systems:   ?Pertinent items are noted in HPI ?Denies abnormal vaginal discharge w/ itching/odor/irritation, headaches, visual changes, shortness of breath, chest pain, abdominal pain, severe nausea/vomiting, or problems with urination or bowel movements unless otherwise stated above. ?Pertinent History Reviewed:  ?Reviewed past medical,surgical, social, obstetrical and family history.  ?Reviewed problem list, medications and allergies. ?Physical Assessment:  ? ?Vitals:  ? 05/20/21 1001  ?BP: 113/63  ?Pulse: 91  ?Weight: 134 lb (60.8 kg)  ?Body mass index is 23 kg/m?. ?     ?     Physical  Examination:  ? General appearance: alert, well appearing, and in no distress ? Mental status: alert, oriented to person, place, and time ? Skin: warm & dry  ? Extremities: Edema: Trace  ?  Cardiovascular: normal heart rate noted ? Respiratory: normal respiratory effort, no distress ? Abdomen: gravid, soft, non-tender ? Pelvic: Cervical exam deferred        ? ?Fetal Status:     Movement: Present   ? ?Fetal Surveillance Testing today: BPP 8/8 73% Dopplers  ? ?Chaperone: N/A   ? ?No results found for this or any previous visit (from the past 24 hour(s)).  ?Assessment & Plan:  ?High-risk pregnancy: G3P2002 at [redacted]w[redacted]d with an Estimated Date of Delivery: 06/27/21  ? ? ?  ICD-10-CM   ?1. High-risk pregnancy in third trimester  O09.93   ?  ?2. Fetal growth restriction antepartum  O36.5990   ? reassuring surveillance  ?  ?3. Gestational diabetes mellitus, class A1  O24.410   ? good control with diet  ?  ?4. 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 stable  ?  ?  ? ? ?Meds: No orders of the defined types were placed in this encounter. ? ? ?Orders: No orders of the defined types were  placed in this encounter. ?  ? ?Labs/procedures today: U/S ? ?Treatment Plan:  twice weekly surveillance, IOL 39 weeks or as clinically indicated ? ?Reviewed: Preterm labor symptoms and general obstetric precautions including but not limited to vaginal bleeding, contractions, leaking of fluid and fetal movement were reviewed in detail with the patient.  All questions were answered. Does have home bp cuff. Office bp cuff given: not applicable. Check bp weekly, let us know if consistently >140 and/or >90. ? ?Follow-up: Return for keep scheduled. ? ? ?Future Appointments  ?Date Time Provider [place]  ?05/27/2021  8:30 AM [facility] - [facility] [place] [facility] None  ?05/27/2021  9:30 AM Roma Schanz, CNM [facility]TO[facility]  ?06/03/2021  2:15 PM [place] - FTOBGYN [place] [facility] None  ?06/03/2021  3:10 PM Florian Buff, MD [facility]  ?06/11/2021  2:15 PM [place]  - FTOBGYN [place] [facility] None  ?06/11/2021  3:10 PM Janyth Pupa, DO [facility] FTOBGYN  ?06/17/2021  1:30 PM [facility] - [facility] [place] [facility] None  ?06/17/2021  2:30 PM Roma Schanz, CNM [facility]TO[facility]  ?06/24/2021 10:30 AM [facility] - [facility] [place] [facility] None  ?06/24/2021 11:30 AM Luvenia Redden, PA-C [facility]TO[facility]  ?07/01/2021  9:45 AM [facility] - [facility] [place] [facility] None  ?07/01/2021 11:10 AM Roma Schanz, CNM [facility]  ? ? ?No orders of the defined types were placed in this encounter. ? ?Florian Buff  ?Attending Physician for the Center for [place] ?[facility] ?05/20/2021 ?11:02 AM ? ?

## 2021-05-20 NOTE — Progress Notes (Addendum)
[place] 34+4 wks,cephalic,anterior placenta gr 0,AFI 13.8 cm,RI .63,.68,.58,.64=73%,BPP 8/8,FHR 146 bpm,left MCDK,largest left renal cyst 3.5 x 3.5 x 3.1 cm ? ?

## 2021-05-26 ENCOUNTER — Other Ambulatory Visit: Payer: Self-pay | Admitting: Obstetrics & Gynecology

## 2021-05-26 DIAGNOSIS — O365930 Maternal care for other known or suspected poor fetal growth, third trimester, not applicable or unspecified: Secondary | ICD-10-CM

## 2021-05-26 DIAGNOSIS — O24410 Gestational diabetes mellitus in pregnancy, diet controlled: Secondary | ICD-10-CM

## 2021-05-26 DIAGNOSIS — Q614 Renal dysplasia: Secondary | ICD-10-CM

## 2021-05-27 ENCOUNTER — Other Ambulatory Visit: Payer: Medicaid Other

## 2021-05-27 ENCOUNTER — Encounter: Payer: Medicaid Other | Admitting: Women's Health

## 2021-06-02 ENCOUNTER — Other Ambulatory Visit: Payer: Self-pay | Admitting: Obstetrics & Gynecology

## 2021-06-02 DIAGNOSIS — O24410 Gestational diabetes mellitus in pregnancy, diet controlled: Secondary | ICD-10-CM

## 2021-06-02 DIAGNOSIS — Q614 Renal dysplasia: Secondary | ICD-10-CM

## 2021-06-02 DIAGNOSIS — O365930 Maternal care for other known or suspected poor fetal growth, third trimester, not applicable or unspecified: Secondary | ICD-10-CM

## 2021-06-03 ENCOUNTER — Ambulatory Visit (INDEPENDENT_AMBULATORY_CARE_PROVIDER_SITE_OTHER): Payer: Medicaid Other

## 2021-06-03 ENCOUNTER — Ambulatory Visit (INDEPENDENT_AMBULATORY_CARE_PROVIDER_SITE_OTHER): Payer: Medicaid Other | Admitting: Obstetrics & Gynecology

## 2021-06-03 VITALS — BP 109/65 | HR 104 | Wt 136.0 lb

## 2021-06-03 DIAGNOSIS — Q614 Renal dysplasia: Secondary | ICD-10-CM

## 2021-06-03 DIAGNOSIS — O365990 Maternal care for other known or suspected poor fetal growth, unspecified trimester, not applicable or unspecified: Secondary | ICD-10-CM

## 2021-06-03 DIAGNOSIS — Z3A36 36 weeks gestation of pregnancy: Secondary | ICD-10-CM

## 2021-06-03 DIAGNOSIS — O365930 Maternal care for other known or suspected poor fetal growth, third trimester, not applicable or unspecified: Secondary | ICD-10-CM | POA: Diagnosis not present

## 2021-06-03 DIAGNOSIS — Z302 Encounter for sterilization: Secondary | ICD-10-CM

## 2021-06-03 DIAGNOSIS — O0993 Supervision of high risk pregnancy, unspecified, third trimester: Secondary | ICD-10-CM

## 2021-06-03 DIAGNOSIS — O24410 Gestational diabetes mellitus in pregnancy, diet controlled: Secondary | ICD-10-CM

## 2021-06-03 DIAGNOSIS — Z3493 Encounter for supervision of normal pregnancy, unspecified, third trimester: Secondary | ICD-10-CM | POA: Diagnosis not present

## 2021-06-03 DIAGNOSIS — O0932 Supervision of pregnancy with insufficient antenatal care, second trimester: Secondary | ICD-10-CM

## 2021-06-03 LAB — POCT URINALYSIS DIPSTICK OB
Blood, UA: NEGATIVE
Glucose, UA: NEGATIVE
Leukocytes, UA: NEGATIVE
Nitrite, UA: NEGATIVE
POC,PROTEIN,UA: NEGATIVE

## 2021-06-03 LAB — OB RESULTS CONSOLE GBS: GBS: NEGATIVE

## 2021-06-03 NOTE — Progress Notes (Signed)
[place] 123456 wks,cephalic,BPP [id number] cm,anterior placenta gr 1,multicystic dysplastic left kidney N/C,normal right kidney,RI .61,.64,.62,.67=75%,EFW 2771 G 34% AC 9.4% ?

## 2021-06-03 NOTE — Progress Notes (Signed)
. ? ? ?HIGH-RISK PREGNANCY VISIT ?Patient name: Maria Henderson MRN [id number]  Date of birth: 1995-09-27 ?Chief Complaint:   ?Routine Prenatal Visit, High Risk Gestation, and Pregnancy Ultrasound ? ?History of Present Illness:   ?Maria Henderson is a 26 y.o. G70P2002 female at [redacted]w[redacted]d with an Estimated Date of Delivery: 06/27/21 being seen today for ongoing management of a high-risk pregnancy complicated by A999333, FGR-->now resolved, MCDK.   ? ?Today she reports no complaints. Contractions: Irritability. Vag. Bleeding: None.  Movement: Present. denies leaking of fluid.  ? ? ?  03/19/2021  ?  3:32 PM 06/09/2018  ?  2:14 PM 12/24/2015  ?  2:59 PM 11/25/2015  ? 10:49 AM  ?Depression screen PHQ 2/9  ?Decreased Interest 0 0 1 1  ?Down, Depressed, Hopeless 0 0 1 1  ?PHQ - 2 Score 0 0 2 2  ?Altered sleeping 1 1 1 3   ?Tired, decreased energy 1 1 3 3   ?Change in appetite 0 0 3 2  ?Feeling bad or failure about yourself  1 0 0 0  ?Trouble concentrating 0 0 0 1  ?Moving slowly or fidgety/restless 0 0 0 1  ?Suicidal thoughts 0 0 0 0  ?PHQ-9 Score 3 2 9 12   ?Difficult doing work/chores  Not difficult at all    ? ?  ? ?  03/19/2021  ?  3:32 PM  ?GAD 7 : Generalized Anxiety Score  ?Nervous, Anxious, on Edge 1  ?Control/stop worrying 1  ?Worry too much - different things 1  ?Trouble relaxing 1  ?Restless 0  ?Easily annoyed or irritable 2  ?Afraid - awful might happen 0  ?Total GAD 7 Score 6  ? ? ? ?Review of Systems:   ?Pertinent items are noted in HPI ?Denies abnormal vaginal discharge w/ itching/odor/irritation, headaches, visual changes, shortness of breath, chest pain, abdominal pain, severe nausea/vomiting, or problems with urination or bowel movements unless otherwise stated above. ?Pertinent History Reviewed:  ?Reviewed past medical,surgical, social, obstetrical and family history.  ?Reviewed problem list, medications and allergies. ?Physical Assessment:  ? ?Vitals:  ? 06/03/21 1506  ?BP: 109/65  ?Pulse: (!) 104  ?Weight: 136 lb (61.7  kg)  ?Body mass index is 23.34 kg/m?. ?     ?     Physical Examination:  ? General appearance: alert, well appearing, and in no distress ? Mental status: alert, oriented to person, place, and time ? Skin: warm & dry  ? Extremities: Edema: None  ?  Cardiovascular: normal heart rate noted ? Respiratory: normal respiratory effort, no distress ? Abdomen: gravid, soft, non-tender ? Pelvic: Cervical exam performed        ? ?Fetal Status:     Movement: Present   ? ?Fetal Surveillance Testing today: BPP 8/8 75% Dopplers  ? ?Chaperone: Glenard Haring [place]   ? ?Results for orders placed or performed in visit on 06/03/21 (from the past 24 hour(s))  ?POC Urinalysis Dipstick OB  ? Collection Time: 06/03/21  3:11 PM  ?Result Value Ref Range  ? Color, UA    ? Clarity, UA    ? Glucose, UA Negative Negative  ? Bilirubin, UA    ? Ketones, UA mod   ? Spec Grav, UA    ? Blood, UA neg   ? pH, UA    ? POC,PROTEIN,UA Negative Negative, Trace, Small (1+), Moderate (2+), Large (3+), 4+  ? Urobilinogen, UA    ? Nitrite, UA neg   ? Leukocytes, UA Negative Negative  ?  Appearance    ? Odor    ?  ?Assessment & Plan:  ?High-risk pregnancy: G3P2002 at [redacted]w[redacted]d with an Estimated Date of Delivery: 06/27/21  ? ? ?  ICD-10-CM   ?1. High-risk pregnancy in third trimester  O09.93 POC Urinalysis Dipstick OB  ?  ?2. Poor fetal growth affecting management of mother in third trimester, single or unspecified fetus  O36.5930 US Fetal BPP W/O Non Stress  ?  [place] UA Cord Doppler  ?  US OB Follow Up  ? Now 34% with normal Dopplers and BPP-->change to weekly BPP/Doppler:  IOL 40 weeks with A1DM  ?  ?3. GDM, class A1  O24.410 US Fetal BPP W/O Non Stress  ?  [place] UA Cord Doppler  ?  US OB Follow Up  ? normal CBG-->EFW34%-->IOL 34 weeks  ?  ?4. Multicystic dysplastic kidney (MCDK)  Q61.4 US Fetal BPP W/O Non Stress  ?  [place] UA Cord Doppler  ?  US OB Follow Up  ? stable, likely will be non functional eventually, normal fluid, will make Peds aware  ?  ?5. [redacted] weeks gestation of pregnancy   Z3A.36 Cervicovaginal ancillary only  ?  Strep Gp B NAA+Rflx  ?  ?  ? ? ?Meds: No orders of the defined types were placed in this encounter. ? ? ?Orders:  ?Orders Placed This Encounter  ?Procedures  ? Strep Gp B NAA+Rflx  ? POC Urinalysis Dipstick OB  ?  ? ?Labs/procedures today: U/S ? ?Treatment Plan:  will change to weekly surveillance now not really FGR ? ?Reviewed: Term labor symptoms and general obstetric precautions including but not limited to vaginal bleeding, contractions, leaking of fluid and fetal movement were reviewed in detail with the patient.  All questions were answered. Does have home bp cuff. Office bp cuff given: not applicable. Check bp daily, let us know if consistently >140 and/or >90. ? ?Follow-up: Return in about 1 week (around 06/10/2021). ? ? ?Future Appointments  ?Date Time Provider [place]  ?06/11/2021  2:15 PM [place] - FTOBGYN [place] [facility] None  ?06/11/2021  3:10 PM Janyth Pupa, DO [facility] FTOBGYN  ?06/17/2021  1:30 PM [facility] - [facility] [place] [facility] None  ?06/17/2021  2:30 PM Roma Schanz, CNM [facility] FTOBGYN  ?06/24/2021 10:30 AM [facility] - [facility] [place] [facility] None  ?06/24/2021 11:30 AM Luvenia Redden, PA-C [facility] FTOBGYN  ?07/01/2021  9:45 AM [facility] - [facility] [place] [facility] None  ?07/01/2021 11:10 AM Roma Schanz, CNM [facility] FTO[facility]YN  ? ? ?Orders Placed This Encounter  ?Procedures  ? Strep Gp B NAA+Rflx  ? POC Urinalysis Dipstick OB  ? ?[place]  ?Attending Physician for the Center for [place] ?[facility] ?06/03/2021 ?3:26 PM ? ?

## 2021-06-05 LAB — CERVICOVAGINAL ANCILLARY ONLY
Chlamydia: NEGATIVE
Comment: NEGATIVE
Comment: NORMAL
Neisseria Gonorrhea: NEGATIVE

## 2021-06-05 LAB — STREP GP B NAA+RFLX: Strep Gp B NAA+Rflx: NEGATIVE

## 2021-06-06 ENCOUNTER — Other Ambulatory Visit: Payer: Self-pay | Admitting: Obstetrics & Gynecology

## 2021-06-06 DIAGNOSIS — O24410 Gestational diabetes mellitus in pregnancy, diet controlled: Secondary | ICD-10-CM

## 2021-06-06 DIAGNOSIS — Q614 Renal dysplasia: Secondary | ICD-10-CM

## 2021-06-11 ENCOUNTER — Ambulatory Visit (INDEPENDENT_AMBULATORY_CARE_PROVIDER_SITE_OTHER): Payer: Medicaid Other

## 2021-06-11 ENCOUNTER — Ambulatory Visit (INDEPENDENT_AMBULATORY_CARE_PROVIDER_SITE_OTHER): Payer: Medicaid Other | Admitting: Obstetrics & Gynecology

## 2021-06-11 ENCOUNTER — Encounter: Payer: Self-pay | Admitting: Obstetrics & Gynecology

## 2021-06-11 ENCOUNTER — Other Ambulatory Visit: Payer: Self-pay | Admitting: Advanced Practice Midwife

## 2021-06-11 VITALS — BP 106/71 | HR 89 | Wt 134.2 lb

## 2021-06-11 DIAGNOSIS — O0993 Supervision of high risk pregnancy, unspecified, third trimester: Secondary | ICD-10-CM

## 2021-06-11 DIAGNOSIS — Q614 Renal dysplasia: Secondary | ICD-10-CM | POA: Diagnosis not present

## 2021-06-11 DIAGNOSIS — O0932 Supervision of pregnancy with insufficient antenatal care, second trimester: Secondary | ICD-10-CM

## 2021-06-11 DIAGNOSIS — Z3A37 37 weeks gestation of pregnancy: Secondary | ICD-10-CM

## 2021-06-11 DIAGNOSIS — O365990 Maternal care for other known or suspected poor fetal growth, unspecified trimester, not applicable or unspecified: Secondary | ICD-10-CM

## 2021-06-11 DIAGNOSIS — O24410 Gestational diabetes mellitus in pregnancy, diet controlled: Secondary | ICD-10-CM | POA: Diagnosis not present

## 2021-06-11 DIAGNOSIS — Z302 Encounter for sterilization: Secondary | ICD-10-CM

## 2021-06-11 LAB — POCT URINALYSIS DIPSTICK OB
Blood, UA: NEGATIVE
Glucose, UA: NEGATIVE
Ketones, UA: NEGATIVE
Leukocytes, UA: NEGATIVE
Nitrite, UA: NEGATIVE
POC,PROTEIN,UA: NEGATIVE

## 2021-06-11 NOTE — Progress Notes (Signed)
? ?HIGH-RISK PREGNANCY VISIT ?Patient name: Maria Henderson MRN [id number]  Date of birth: Oct 19, 1995 ?Chief Complaint:   ?High Risk Gestation ([place] today) ? ?History of Present Illness:   ?Maria Henderson is a 26 y.o. G12P2002 female at 44w5dwith an Estimated Date of Delivery: 06/27/21 being seen today for ongoing management of a high-risk pregnancy complicated by: ? ?[facility]   ?Left Multi-cystic dysplastic kidney ? ?Today she reports irregular contractions ? ?Contractions: Irritability. Vag. Bleeding: None.  Movement: Present. denies leaking of fluid.  ? ? ?  03/19/2021  ?  3:32 PM 06/09/2018  ?  2:14 PM 12/24/2015  ?  2:59 PM 11/25/2015  ? 10:49 AM  ?Depression screen PHQ 2/9  ?Decreased Interest 0 0 1 1  ?Down, Depressed, Hopeless 0 0 1 1  ?PHQ - 2 Score 0 0 2 2  ?Altered sleeping 1 1 1 3   ?Tired, decreased energy 1 1 3 3   ?Change in appetite 0 0 3 2  ?Feeling bad or failure about yourself  1 0 0 0  ?Trouble concentrating 0 0 0 1  ?Moving slowly or fidgety/restless 0 0 0 1  ?Suicidal thoughts 0 0 0 0  ?PHQ-9 Score 3 2 9 12   ?Difficult doing work/chores  Not difficult at all    ? ? ? ?Current Outpatient Medications  ?Medication Instructions  ? Accu-Chek Softclix Lancets lancets Use as instructed to check blood sugar 4 times daily  ? acetaminophen (TYLENOL) 325 mg, Oral, Every 6 hours PRN  ? Blood Glucose Monitoring Suppl (ACCU-CHEK GUIDE ME) w/Device KIT 1 each, Does not apply, 4 times daily  ? Doxylamine-Pyridoxine 10-10 MG TBEC 10 mg, Oral, Nightly  ? glucose blood (ACCU-CHEK GUIDE) test strip Use as instructed to check blood sugar 4 times daily  ? prenatal vitamin w/FE, FA (PRENATAL 1 + 1) 27-1 MG TABS tablet 1 tablet, Oral, Daily  ?  ? ?Review of Systems:   ?Pertinent items are noted in HPI ?Denies abnormal vaginal discharge w/ itching/odor/irritation, headaches, visual changes, shortness of breath, chest pain, abdominal pain, severe nausea/vomiting, or problems with urination or bowel movements  unless otherwise stated above. ?Pertinent History Reviewed:  ?Reviewed past medical,surgical, social, obstetrical and family history.  ?Reviewed problem list, medications and allergies. ?Physical Assessment:  ? ?Vitals:  ? 06/11/21 1504  ?BP: 106/71  ?Pulse: 89  ?Weight: 134 lb 3.2 oz (60.9 kg)  ?Body mass index is 23.04 kg/m?. ?     ?     Physical Examination:  ? General appearance: alert, well appearing, and in no distress ? Mental status: normal mood, behavior, speech, dress, motor activity, and thought processes ? Skin: warm & dry  ? Extremities: Edema: Trace  ?  Cardiovascular: normal heart rate noted ? Respiratory: normal respiratory effort, no distress ? Abdomen: gravid, soft, non-tender ? Pelvic: Cervical exam performed  Dilation: Fingertip Effacement (%): 60 Station: -2 ? ?Fetal Status:     Movement: Present Presentation: Vertex ? ?Fetal Surveillance Testing today: BPP/doppler- ,cephalic,anterior placenta gr 1,FHR 129 bpm,RI .61,.53,.60,.64=67%,BPP 8/8,AFI 14.7 cm,MCDLK,largest cyst LK 3.3 X 2.8 X 2.7 cm  ? ?Chaperone:  pt declined    ? ?Results for orders placed or performed in visit on 06/11/21 (from the past 24 hour(s))  ?POC Urinalysis Dipstick OB  ? Collection Time: 06/11/21  3:00 PM  ?Result Value Ref Range  ? Color, UA    ? Clarity, UA    ? Glucose, UA Negative Negative  ? Bilirubin,  UA    ? Ketones, UA neg   ? Spec Grav, UA    ? Blood, UA neg   ? pH, UA    ? POC,PROTEIN,UA Negative Negative, Trace, Small (1+), Moderate (2+), Large (3+), 4+  ? Urobilinogen, UA    ? Nitrite, UA neg   ? Leukocytes, UA Negative Negative  ? Appearance    ? Odor    ?  ? ?Assessment & Plan:  ?High-risk pregnancy: G3P2002 at 64w5dwith an Estimated Date of Delivery: 06/27/21  ? ?1) FGR ?-discussed plan for IOL 38-39wks, scheduled for this weekend ?-BPP 8/8, normal dopplers ? ?2) Left MCDK ? ?Meds: No orders of the defined types were placed in this encounter. ? ? ?Labs/procedures today: BPP/dopplers ? ?Treatment Plan:  as  outlined above ? ?Reviewed: Term labor symptoms and general obstetric precautions including but not limited to vaginal bleeding, contractions, leaking of fluid and fetal movement were reviewed in detail with the patient.  All questions were answered.  ? ?Follow-up: Return in about 1 week (around 06/18/2021) for next visit 5wk postpartum. ? ? ?Future Appointments  ?Date Time Provider [place] ?06/17/2021  1:30 PM [place] [place] None  ?06/17/2021  2:30 PM BRoma Schanz CNM [facility]-FT FTOBGYN  ?06/24/2021 10:30 AM [facility] [place] None  ?06/24/2021 11:30 AM WLuvenia Redden PA-C [facility]-FT FTOBGYN  ? ? ?Orders Placed This Encounter  ?Procedures  ? POC Urinalysis Dipstick OB  ? ? ?JJanyth Pupa DO ?Attending [facility] ?[facility] [place]? ? ? ?

## 2021-06-11 NOTE — Progress Notes (Signed)
[place] 37+5 wks,cephalic,anterior placenta gr 1,FHR 129 bpm,RI .61,.53,.60,.64=67%,BPP 8/8,AFI 14.7 cm,MCDLK,largest cyst LK 3.3 X 2.8 X 2.7 cm ?

## 2021-06-12 ENCOUNTER — Encounter (HOSPITAL_COMMUNITY): Payer: Self-pay

## 2021-06-12 ENCOUNTER — Telehealth (HOSPITAL_COMMUNITY): Payer: Self-pay | Admitting: *Deleted

## 2021-06-12 NOTE — Telephone Encounter (Signed)
Preadmission screen  

## 2021-06-13 ENCOUNTER — Telehealth (HOSPITAL_COMMUNITY): Payer: Self-pay | Admitting: *Deleted

## 2021-06-13 NOTE — Telephone Encounter (Deleted)
Hello Darah   I am trying to reach you to review your Medical History and answer any questions you have regarding your Induction of Labor.  My number is [phone].  I am in the office from 8:30-4:30 Monday thru Friday.    Teresa Coombs MSN [facility] Preadmission Nurse

## 2021-06-13 NOTE — Telephone Encounter (Signed)
Preadmission screen  

## 2021-06-14 ENCOUNTER — Encounter (HOSPITAL_COMMUNITY): Payer: Self-pay | Admitting: Obstetrics & Gynecology

## 2021-06-14 ENCOUNTER — Other Ambulatory Visit: Payer: Self-pay | Admitting: Advanced Practice Midwife

## 2021-06-14 ENCOUNTER — Inpatient Hospital Stay (HOSPITAL_COMMUNITY)
Admission: AD | Admit: 2021-06-14 | Discharge: 2021-06-16 | DRG: 807 | Disposition: A | Discharge status: Home / Self Care | Payer: Medicaid Other | Attending: Obstetrics & Gynecology | Admitting: Obstetrics & Gynecology

## 2021-06-14 ENCOUNTER — Inpatient Hospital Stay (HOSPITAL_COMMUNITY): Payer: Medicaid Other

## 2021-06-14 ENCOUNTER — Other Ambulatory Visit: Payer: Self-pay

## 2021-06-14 DIAGNOSIS — O24420 Gestational diabetes mellitus in childbirth, diet controlled: Secondary | ICD-10-CM | POA: Diagnosis not present

## 2021-06-14 DIAGNOSIS — Z91199 Patient's noncompliance with other medical treatment and regimen due to unspecified reason: Secondary | ICD-10-CM

## 2021-06-14 DIAGNOSIS — D573 Sickle-cell trait: Secondary | ICD-10-CM | POA: Diagnosis not present

## 2021-06-14 DIAGNOSIS — O365990 Maternal care for other known or suspected poor fetal growth, unspecified trimester, not applicable or unspecified: Principal | ICD-10-CM | POA: Diagnosis present

## 2021-06-14 DIAGNOSIS — Z880 Allergy status to penicillin: Secondary | ICD-10-CM

## 2021-06-14 DIAGNOSIS — O9902 Anemia complicating childbirth: Secondary | ICD-10-CM | POA: Diagnosis present

## 2021-06-14 DIAGNOSIS — Z3A38 38 weeks gestation of pregnancy: Secondary | ICD-10-CM | POA: Diagnosis not present

## 2021-06-14 DIAGNOSIS — O365930 Maternal care for other known or suspected poor fetal growth, third trimester, not applicable or unspecified: Secondary | ICD-10-CM | POA: Diagnosis present

## 2021-06-14 DIAGNOSIS — O24410 Gestational diabetes mellitus in pregnancy, diet controlled: Secondary | ICD-10-CM

## 2021-06-14 LAB — CBC
HCT: 32.4 % — ABNORMAL LOW (ref 36.0–46.0)
Hemoglobin: 11.4 g/dL — ABNORMAL LOW (ref 12.0–15.0)
MCH: 28.8 pg (ref 26.0–34.0)
MCHC: 35.2 g/dL (ref 30.0–36.0)
MCV: 81.8 fL (ref 80.0–100.0)
Platelets: 267 10*3/uL (ref 150–400)
RBC: 3.96 MIL/uL (ref 3.87–5.11)
RDW: 13.6 % (ref 11.5–15.5)
WBC: 4.3 10*3/uL (ref 4.0–10.5)
nRBC: 0 % (ref 0.0–0.2)

## 2021-06-14 LAB — GLUCOSE, CAPILLARY
Glucose-Capillary: 88 mg/dL (ref 70–99)
Glucose-Capillary: 68 mg/dL — ABNORMAL LOW (ref 70–99)
Glucose-Capillary: 79 mg/dL (ref 70–99)

## 2021-06-14 LAB — TYPE AND SCREEN
ABO/RH(D): B POS
Antibody Screen: NEGATIVE

## 2021-06-14 MED ORDER — OXYCODONE-ACETAMINOPHEN 5-325 MG PO TABS: 2.0000 | ORAL_TABLET | ORAL | Status: DC | PRN

## 2021-06-14 MED ORDER — ONDANSETRON HCL 4 MG/2ML IJ SOLN: 4.0000 mg | Freq: Four times a day (QID) | INTRAMUSCULAR | Status: DC | PRN

## 2021-06-14 MED ORDER — OXYTOCIN-SODIUM CHLORIDE 30-0.9 UT/500ML-% IV SOLN
2.5000 [IU]/h | INTRAVENOUS | Status: DC
Start: 2021-06-14 — End: 2021-06-15
  Administered 2021-06-15: 2.5 [IU]/h via INTRAVENOUS
  Filled 2021-06-14: qty 500

## 2021-06-14 MED ORDER — FENTANYL CITRATE (PF) 100 MCG/2ML IJ SOLN
100.0000 ug | INTRAMUSCULAR | Status: DC | PRN
  Administered 2021-06-14 (×2): 100 ug via INTRAVENOUS
  Filled 2021-06-14 (×2): qty 2

## 2021-06-14 MED ORDER — LACTATED RINGERS IV SOLN: INTRAVENOUS | Status: DC

## 2021-06-14 MED ORDER — LIDOCAINE HCL (PF) 1 % IJ SOLN: 30.0000 mL | INTRAMUSCULAR | Status: DC | PRN

## 2021-06-14 MED ORDER — OXYTOCIN-SODIUM CHLORIDE 30-0.9 UT/500ML-% IV SOLN
1.0000 m[IU]/min | INTRAVENOUS | Status: DC
  Administered 2021-06-14: 2 m[IU]/min via INTRAVENOUS

## 2021-06-14 MED ORDER — OXYCODONE-ACETAMINOPHEN 5-325 MG PO TABS: 1.0000 | ORAL_TABLET | ORAL | Status: DC | PRN

## 2021-06-14 MED ORDER — TERBUTALINE SULFATE 1 MG/ML IJ SOLN: 0.2500 mg | Freq: Once | INTRAMUSCULAR | Status: DC | PRN

## 2021-06-14 MED ORDER — MISOPROSTOL 25 MCG QUARTER TABLET: 25.0000 ug | ORAL_TABLET | ORAL | Status: DC | PRN

## 2021-06-14 MED ORDER — SOD CITRATE-CITRIC ACID 500-334 MG/5ML PO SOLN: 30.0000 mL | ORAL | Status: DC | PRN

## 2021-06-14 MED ORDER — LACTATED RINGERS IV SOLN: 500.0000 mL | INTRAVENOUS | Status: DC | PRN

## 2021-06-14 MED ORDER — ACETAMINOPHEN 325 MG PO TABS
650.0000 mg | ORAL_TABLET | ORAL | Status: DC | PRN
  Administered 2021-06-15: 650 mg via ORAL
  Filled 2021-06-14: qty 2

## 2021-06-14 MED ORDER — OXYTOCIN BOLUS FROM INFUSION
333.0000 mL | Freq: Once | INTRAVENOUS | Status: AC
  Administered 2021-06-15: 333 mL via INTRAVENOUS

## 2021-06-14 NOTE — H&P (Addendum)
OBSTETRIC ADMISSION HISTORY AND PHYSICAL  Maria Henderson is a 26 y.o. female G72P2002 with IUP at 48w1dby LMP presenting for IOL A1GDM noncompliant. Has not been checking blood sugars regularly as well as missed diabetes education appointment. Pregnancy also significant for FGR, however resolved by most recent ultrasound. She denies contractions, leaking fluid or vaginal bleeding. Endorses active fetal movement.  Denies blurry vision, headaches or peripheral edema, and RUQ pain.  She plans on breast and bottle feeding. She request IUD for birth control. [facility]  Dating: By LMP --->  Estimated Date of Delivery: 06/27/21  Sono:    _0 , CWD, left multicystic dysplastic kidney, otherwise normal anatomy, cephalic presentation, anterior placenta, 318g, 14.5% EFW  _1 , CWD, left multicystic dysplastic kidney, otherwise normal anatomy, cephalic presentation, anterior placenta, 2771g, 34% EFW  Prenatal History/Complications:  - Noncompliant A1GDM - FGR (resolved) - Left MCDK - Sickle cell trait  Past Medical History: Past Medical History:  Diagnosis Date   Medical history non-contributory     Past Surgical History: Past Surgical History:  Procedure Laterality Date   arm surgery Right    broke arm at 26years old   WISDOM TOOTH EXTRACTION      Obstetrical History: OB History     Gravida  3   Para  2   Term  2   Preterm      AB      Living  2      SAB      IAB      Ectopic      Multiple  0   Live Births  2          Social History Social History   Socioeconomic History   Marital status: Single    Spouse name: Dion Pettiford   Number of children: 2   Years of education: 12   Highest education level: 12th grade  Occupational History   Not on file  Tobacco Use   Smoking status: Never   Smokeless tobacco: Never  Vaping Use   Vaping Use: Never used  Substance and Sexual Activity   Alcohol use: Not Currently     Comment: not now   Drug use: Not Currently    Types: Marijuana   Sexual activity: Yes    Birth control/protection: None  Other Topics Concern   Not on file  Social History Narrative   Not on file   Social Determinants of Health   Financial Resource Strain: Low Risk    Difficulty of Paying Living Expenses: Not very hard  Food Insecurity: Food Insecurity Present   Worried About Running Out of Food in the Last Year: Sometimes true   Ran Out of Food in the Last Year: Sometimes true  Transportation Needs: Unmet Transportation Needs   Lack of Transportation (Medical): Yes   Lack of Transportation (Non-Medical): No  Physical Activity: Insufficiently Active   Days of Exercise per Week: 2 days   Minutes of Exercise per Session: 30 min  Stress: Stress Concern Present   Feeling of Stress : To some extent  Social Connections: Moderately Integrated   Frequency of Communication with Friends and Family: More than three times a week   Frequency of Social Gatherings with Friends and Family: Once a week   Attends Religious Services: 1 to 4 times per year   Active Member of [employer] Organizations: No   Attends CArchivistMeetings: Never   Marital Status:  Living with partner    Family History: Family History  Problem Relation Age of Onset   Diabetes Paternal Grandmother    Breast cancer Mother    Diabetes Paternal Aunt     Allergies: Allergies  Allergen Reactions   Penicillins Anaphylaxis and Swelling    Has patient had a PCN reaction causing immediate rash, facial/tongue/throat swelling, SOB or lightheadedness with hypotension: Yes Has patient had a PCN reaction causing severe rash involving mucus membranes or skin necrosis: Yes Has patient had a PCN reaction that required hospitalization: No Has patient had a PCN reaction occurring within the last 10 years: No If all of the above answers are "NO", then may proceed with Cephalosporin use.    Kiwi Extract Other (See  Comments)    Mouth ulcers    Medications Prior to Admission  Medication Sig Dispense Refill Last Dose   Accu-Chek Softclix Lancets lancets Use as instructed to check blood sugar 4 times daily 100 each 12    acetaminophen (TYLENOL) 325 MG tablet Take 325 mg by mouth every 6 (six) hours as needed for mild pain or headache.      Blood Glucose Monitoring Suppl (ACCU-CHEK GUIDE ME) w/Device KIT 1 each by Does not apply route 4 (four) times daily. 1 kit 0    Doxylamine-Pyridoxine 10-10 MG TBEC Take 10 mg by mouth at bedtime. (Patient not taking: Reported on 06/11/2021) 60 tablet 3    glucose blood (ACCU-CHEK GUIDE) test strip Use as instructed to check blood sugar 4 times daily 50 each 12    prenatal vitamin w/FE, FA (PRENATAL 1 + 1) 27-1 MG TABS tablet Take 1 tablet by mouth daily at 12 noon. 90 tablet 3    Review of Systems   All systems reviewed and negative except as stated in HPI  Blood pressure 115/77, pulse (!) 103, temperature 98.2 F (36.8 C), temperature source Oral, resp. rate 16, height 5' 4" (1.626 m), weight 61 kg, last menstrual period 09/20/2020, currently breastfeeding. General appearance: alert, cooperative, and no distress Lungs: effort normal Heart: regular rate  Abdomen: soft, non-tender; gravid Pelvic: adequate Extremities: no sign of DVT Presentation: cephalic Fetal monitoring: 130 bpm, moderate variability, +15x15 accels, no decels Uterine activity: irregular with ui Dilation: 2.5 Effacement (%): 50 Station: -1 Exam by:: Tyia Binford,CNM  Prenatal labs: ABO, Rh: --/--/PENDING (05/20 1532) Antibody: PENDING (05/20 1532) Rubella: 10.10 (03/17 0807) RPR: Non Reactive (03/17 0807)  HBsAg: Negative (03/17 0807)  HIV: Non Reactive (03/17 0807)  GBS: --[name] (05/09 1630)  2 hr Glucola: 93/153/158 Genetic screening: Declined Anatomy US: Left MCDK, otherwise normal  Prenatal Transfer Tool  Maternal Diabetes: Yes:  Diabetes Type:  Diet controlled Genetic  Screening: Declined Maternal Ultrasounds/Referrals: Other: Left MCDK Fetal Ultrasounds or other Referrals:  None Maternal Substance Abuse:  No Significant Maternal Medications:  None Significant Maternal Lab Results: Group B Strep negative  Results for orders placed or performed during the hospital encounter of 06/14/21 (from the past 24 hour(s))  CBC   Collection Time: 06/14/21  3:23 PM  Result Value Ref Range   WBC 4.3 4.0 - 10.5 K/uL   RBC 3.96 3.87 - 5.11 MIL/uL   Hemoglobin 11.4 (L) 12.0 - 15.0 g/dL   HCT 32.4 (L) 36.0 - 46.0 %   MCV 81.8 80.0 - 100.0 fL   MCH 28.8 26.0 - 34.0 pg   MCHC 35.2 30.0 - 36.0 g/dL   RDW 13.6 11.5 - 15.5 %   Platelets 267 150 - 400 K/uL  nRBC 0.0 0.0 - 0.2 %  Type and screen   Collection Time: 06/14/21  3:32 PM  Result Value Ref Range   ABO/RH(D) PENDING    Antibody Screen PENDING    Sample Expiration      06/17/2021,2359 Performed at [facility], [place] [address] [place], [place] [address]   Glucose, capillary   Collection Time: 06/14/21  4:03 PM  Result Value Ref Range   Glucose-Capillary 88 70 - 99 mg/dL    Patient Active Problem List   Diagnosis Date Noted   Pregnancy affected by fetal growth restriction 06/14/2021   Fetal growth restriction antepartum 05/13/2021   Gestational diabetes mellitus, class A1 04/15/2021   Request for sterilization 03/20/2021   Fetal hydronephrosis during pregnancy, antepartum 03/20/2021   Encounter for supervision of high risk pregnancy in third trimester, antepartum 03/18/2021   Late prenatal care in second trimester 03/18/2021   Family history of breast cancer 09/20/2018   Sickle cell trait ([place]) 12/26/2015    Assessment/Plan:  DEAH OTTAWAY is a 26 y.o. G3P2002 at 10w1dhere for IOL for noncompliant A1GDM.   #Labor: I reviewed IOL methods including cytotec, AROM, and Pitocin given cervical dilation. Patient opts for AROM. AROM with small amount of clear, pink-tinged fluid. Patient and  fetus tolerated procedure well. Can start Pitocin prn at later time #A1GDM: Q4H CBG's. Admit CBG 88.  #Pain: Epidural prn #FWB: Cat 1 #ID: GBS neg #MOF: Both #MOC: IUD #Circ: NGays CNM  06/14/2021, 4:25 PM

## 2021-06-14 NOTE — Progress Notes (Signed)
Maria Henderson is a 26 y.o. G3P2002 at [redacted]w[redacted]d admitted for IOL d/t noncompliant A1GDM.  Subjective: Patient reports feeling irregular mild contractions.  Objective: BP 115/77   Pulse (!) 103   Temp 98.2 F (36.8 C) (Oral)   Resp 16   Ht 5\' 4"  (1.626 m)   Wt 61 kg   LMP 09/20/2020   BMI 23.09 kg/m  No intake/output data recorded. No intake/output data recorded.  FHT: 125 bpm, moderate variability, +15x15 accels, no decels UC: Q 09/22/2020 SVE:   Dilation: 3.5 Effacement (%): 50 Station: -1 Exam by:: [email or web address], CNM  Labs: Lab Results  Component Value Date   WBC 4.3 06/14/2021   HGB 11.4 (L) 06/14/2021   HCT 32.4 (L) 06/14/2021   MCV 81.8 06/14/2021   PLT 267 06/14/2021    Assessment / Plan: Maria Henderson is a 26 y.o. G3P2002 at [redacted]w[redacted]d admitted for IOL d/t noncompliact A1GDM  Labor: Will start low-dose Pitocin per unit policy A1GDM: Q4H CBG's Fetal Wellbeing:  Category I Pain Control:   Prn I/D:   GBS neg Anticipated MOD:  NSVD    [redacted]w[redacted]d, CNM 06/14/2021, 7:54 PM

## 2021-06-14 NOTE — Progress Notes (Signed)
Pt comfortable at bs following IV pain medication admin. Reports mild pressure with CTXs without discomfort. FHTs cat 1 with early decelerations present. Plan to continue pitocin titration, epidural PRN if requested. BG 68 at 2004. 4 oz juice given with resulting BG 79 at 2209. Recheck BG in 4 hours, SVE at 0000.   Electronically Signed By: Ardyth Harps, SNM 06/14/21 10:52 PM

## 2021-06-15 ENCOUNTER — Encounter (HOSPITAL_COMMUNITY): Payer: Self-pay | Admitting: Obstetrics & Gynecology

## 2021-06-15 DIAGNOSIS — O365930 Maternal care for other known or suspected poor fetal growth, third trimester, not applicable or unspecified: Secondary | ICD-10-CM | POA: Diagnosis not present

## 2021-06-15 DIAGNOSIS — O24420 Gestational diabetes mellitus in childbirth, diet controlled: Secondary | ICD-10-CM | POA: Diagnosis not present

## 2021-06-15 DIAGNOSIS — Z3A38 38 weeks gestation of pregnancy: Secondary | ICD-10-CM | POA: Diagnosis not present

## 2021-06-15 LAB — CBC
HCT: 29.7 % — ABNORMAL LOW (ref 36.0–46.0)
Hemoglobin: 10.2 g/dL — ABNORMAL LOW (ref 12.0–15.0)
MCH: 28.5 pg (ref 26.0–34.0)
MCHC: 34.3 g/dL (ref 30.0–36.0)
MCV: 83 fL (ref 80.0–100.0)
Platelets: 250 10*3/uL (ref 150–400)
RBC: 3.58 MIL/uL — ABNORMAL LOW (ref 3.87–5.11)
RDW: 13.6 % (ref 11.5–15.5)
WBC: 11.7 10*3/uL — ABNORMAL HIGH (ref 4.0–10.5)
nRBC: 0 % (ref 0.0–0.2)

## 2021-06-15 LAB — RPR: RPR Ser Ql: NONREACTIVE

## 2021-06-15 MED ORDER — TETANUS-DIPHTH-ACELL PERTUSSIS 5-2.5-18.5 LF-MCG/0.5 IM SUSY: 0.5000 mL | PREFILLED_SYRINGE | Freq: Once | INTRAMUSCULAR | Status: DC

## 2021-06-15 MED ORDER — WITCH HAZEL-GLYCERIN EX PADS
1.0000 | MEDICATED_PAD | CUTANEOUS | Status: DC | PRN
Start: 2021-06-15 — End: 2021-06-16

## 2021-06-15 MED ORDER — ONDANSETRON HCL 4 MG/2ML IJ SOLN
4.0000 mg | INTRAMUSCULAR | Status: DC | PRN
Start: 2021-06-15 — End: 2021-06-16

## 2021-06-15 MED ORDER — SENNOSIDES-DOCUSATE SODIUM 8.6-50 MG PO TABS
2.0000 | ORAL_TABLET | Freq: Every day | ORAL | Status: DC
  Administered 2021-06-15 – 2021-06-16 (×2): 2 via ORAL
  Filled 2021-06-15 (×2): qty 2

## 2021-06-15 MED ORDER — SIMETHICONE 80 MG PO CHEW: 80.0000 mg | CHEWABLE_TABLET | ORAL | Status: DC | PRN

## 2021-06-15 MED ORDER — DIBUCAINE (PERIANAL) 1 % EX OINT
1.0000 | TOPICAL_OINTMENT | CUTANEOUS | Status: DC | PRN
Start: 2021-06-15 — End: 2021-06-16

## 2021-06-15 MED ORDER — COCONUT OIL OIL
1.0000 "application " | TOPICAL_OIL | Status: DC | PRN
  Administered 2021-06-15: 1 via TOPICAL

## 2021-06-15 MED ORDER — IBUPROFEN 600 MG PO TABS
600.0000 mg | ORAL_TABLET | Freq: Four times a day (QID) | ORAL | Status: DC
Start: 2021-06-15 — End: 2021-06-16
  Administered 2021-06-15 – 2021-06-16 (×6): 600 mg via ORAL
  Filled 2021-06-15 (×6): qty 1

## 2021-06-15 MED ORDER — PRENATAL MULTIVITAMIN CH
1.0000 | ORAL_TABLET | Freq: Every day | ORAL | Status: DC
  Administered 2021-06-15 – 2021-06-16 (×2): 1 via ORAL
  Filled 2021-06-15 (×2): qty 1

## 2021-06-15 MED ORDER — ONDANSETRON HCL 4 MG PO TABS: 4.0000 mg | ORAL_TABLET | ORAL | Status: DC | PRN

## 2021-06-15 MED ORDER — ZOLPIDEM TARTRATE 5 MG PO TABS: 5.0000 mg | ORAL_TABLET | Freq: Every evening | ORAL | Status: DC | PRN

## 2021-06-15 MED ORDER — ACETAMINOPHEN 325 MG PO TABS
650.0000 mg | ORAL_TABLET | ORAL | Status: DC | PRN
  Administered 2021-06-15 (×3): 650 mg via ORAL
  Filled 2021-06-15 (×2): qty 2

## 2021-06-15 MED ORDER — DIPHENHYDRAMINE HCL 25 MG PO CAPS: 25.0000 mg | ORAL_CAPSULE | Freq: Four times a day (QID) | ORAL | Status: DC | PRN

## 2021-06-15 MED ORDER — BENZOCAINE-MENTHOL 20-0.5 % EX AERO: 1.0000 "application " | INHALATION_SPRAY | CUTANEOUS | Status: DC | PRN

## 2021-06-15 NOTE — Plan of Care (Signed)

## 2021-06-15 NOTE — Lactation Note (Signed)
This note was copied from a baby's chart. Lactation Consultation Note Mom declined LC services.   Patient Name: Maria Henderson NOMVE'H Date: 06/15/2021   Age:27 hours  Maternal Data    Feeding    LATCH Score                    Lactation Tools Discussed/Used    Interventions    Discharge    Consult Status Consult Status: Complete    Bralynn Donado G 06/15/2021, 3:07 AM

## 2021-06-15 NOTE — Discharge Summary (Signed)
Postpartum Discharge Summary    Patient Name: Maria Henderson DOB: Jan 15, 1996 MRN: [id number]  Date of admission: 06/14/2021 Delivery date:06/15/2021  Delivering provider: Greggory Keen S  Date of discharge: 06/16/2021  Admitting diagnosis: Pregnancy affected by fetal growth restriction [O36.5990] Intrauterine pregnancy: [redacted]w[redacted]d    Secondary diagnosis:  Principal Problem:   Pregnancy affected by fetal growth restriction  Additional problems: A1GDM    Discharge diagnosis: Term Pregnancy Delivered                                              Post partum procedures: None Augmentation: AROM and Pitocin Complications: None  Hospital course: Induction of Labor With Vaginal Delivery   26y.o. yo G3P2002 at 370w2das admitted to the hospital 06/14/2021 for induction of labor.  Indication for induction: Favorable cervix at term, A1 DM, and h/o of FGR (resolved) .  Patient had an uncomplicated labor course as follows: Membrane Rupture Time/Date: 3:59 PM ,06/14/2021   Delivery Method:Vaginal, Spontaneous  Episiotomy: None  Lacerations:  None  Details of delivery can be found in separate delivery note.  Patient had a routine postpartum course. Patient is discharged home 06/16/21.  Newborn Data: Birth date:06/15/2021  Birth time:12:55 AM  Gender:Female  Living status:Living  Apgars:9 ,9  Weight:2860 g   Magnesium Sulfate received: No BMZ received: No Rhophylac:No MMR:No T-DaP: declined Flu: N/A Transfusion:No  Physical exam  Vitals:   06/15/21 1241 06/15/21 1756 06/15/21 2100 06/16/21 0524  BP: (!) 102/59 102/65 108/63 105/66  Pulse: 70 78 90 79  Resp: 20 20 18 18   Temp: 97.8 F (36.6 C) 98.3 F (36.8 C) 98.6 F (37 C) 98.1 F (36.7 C)  TempSrc:   Oral Oral  SpO2:   100% 100%  Weight:      Height:       General: alert, cooperative, and no distress Lochia: appropriate Uterine Fundus: firm Incision: N/A DVT Evaluation: No evidence of DVT seen on physical exam. No  cords or calf tenderness. No significant calf/ankle edema. Labs: Lab Results  Component Value Date   WBC 11.7 (H) 06/15/2021   HGB 10.2 (L) 06/15/2021   HCT 29.7 (L) 06/15/2021   MCV 83.0 06/15/2021   PLT 250 06/15/2021       View : No data to display.         Edinburgh Score:    06/16/2021   10:50 AM  EdFlavia Shipperostnatal Depression Scale Screening Tool  I have been able to laugh and see the funny side of things. 0  I have looked forward with enjoyment to things. 0  I have blamed myself unnecessarily when things went wrong. 1  I have been anxious or worried for no good reason. 0  I have felt scared or panicky for no good reason. 0  Things have been getting on top of me. 1  I have been so unhappy that I have had difficulty sleeping. 0  I have felt sad or miserable. 0  I have been so unhappy that I have been crying. 0  The thought of harming myself has occurred to me. 0  Edinburgh Postnatal Depression Scale Total 2     After visit meds:  Allergies as of 06/16/2021       Reactions   Penicillins Anaphylaxis, Swelling   Has patient had a PCN reaction causing immediate  rash, facial/tongue/throat swelling, SOB or lightheadedness with hypotension: Yes Has patient had a PCN reaction causing severe rash involving mucus membranes or skin necrosis: Yes Has patient had a PCN reaction that required hospitalization: No Has patient had a PCN reaction occurring within the last 10 years: No If all of the above answers are "NO", then may proceed with Cephalosporin use.   Kiwi Extract Other (See Comments)   Mouth ulcers        Medication List     STOP taking these medications    Accu-Chek Guide Me w/Device Kit   Accu-Chek Guide test strip Generic drug: glucose blood   Accu-Chek Softclix Lancets lancets   Doxylamine-Pyridoxine 10-10 MG Tbec       TAKE these medications    acetaminophen 325 MG tablet Commonly known as: TYLENOL Take 325 mg by mouth every 6 (six) hours  as needed for mild pain or headache.   coconut oil Oil Apply 1 application. topically as needed.   ibuprofen 600 MG tablet Commonly known as: ADVIL Take 1 tablet (600 mg total) by mouth every 6 (six) hours as needed.   prenatal vitamin w/FE, FA 27-1 MG Tabs tablet Take 1 tablet by mouth daily at 12 noon.   senna-docusate 8.6-50 MG tablet Commonly known as: Senokot-S Take 2 tablets by mouth daily.         Discharge home in stable condition Infant Feeding:  Both Infant Disposition:home with mother Discharge instruction: per After Visit Summary and Postpartum booklet. Activity: Advance as tolerated. Pelvic rest for 6 weeks.  Diet: routine diet Future Appointments: Future Appointments  Date Time Provider [place]  07/28/2021  8:50 AM Roma Schanz, CNM [facility]   Follow up Visit:  Follow-up Information     [facility]. Schedule an appointment as soon as possible for a visit in 6 week(s).   Specialty: Obstetrics and Gynecology Why: postpartum visit and IUD insertion Contact information: [place] [phone]                Message sent to [facility] on 06/15/2021 by R. Renato Battles, CNM Please schedule this patient for a In person postpartum visit in 6 weeks with the following provider: Any provider. Additional Postpartum F/U: none   Low risk pregnancy complicated by:  none Delivery mode:  Vaginal, Spontaneous  Anticipated Birth Control:  IUD at [facility] visit   06/16/2021 Lenoria Chime, MD

## 2021-06-16 LAB — BIRTH TISSUE RECOVERY COLLECTION (PLACENTA DONATION)

## 2021-06-16 MED ORDER — SENNOSIDES-DOCUSATE SODIUM 8.6-50 MG PO TABS: 2.0000 | ORAL_TABLET | Freq: Every day | ORAL | Status: DC

## 2021-06-16 MED ORDER — IBUPROFEN 600 MG PO TABS: 600.0000 mg | ORAL_TABLET | Freq: Four times a day (QID) | ORAL | 0 refills | Status: DC | PRN

## 2021-06-16 MED ORDER — COCONUT OIL OIL: 1.0000 "application " | TOPICAL_OIL | 0 refills | Status: DC | PRN

## 2021-06-17 ENCOUNTER — Other Ambulatory Visit: Payer: Medicaid Other

## 2021-06-17 ENCOUNTER — Encounter: Payer: Medicaid Other | Admitting: Women's Health

## 2021-06-24 ENCOUNTER — Encounter: Payer: Medicaid Other | Admitting: Medical

## 2021-06-24 ENCOUNTER — Telehealth (HOSPITAL_COMMUNITY): Payer: Self-pay

## 2021-06-24 ENCOUNTER — Other Ambulatory Visit: Payer: Medicaid Other

## 2021-06-24 NOTE — Telephone Encounter (Signed)
No answer. Left message to return nurse call.  Marcelino Duster [facility] 05/30//2023,1854

## 2021-07-01 ENCOUNTER — Other Ambulatory Visit: Payer: Medicaid Other

## 2021-07-01 ENCOUNTER — Encounter: Payer: Medicaid Other | Admitting: Women's Health

## 2021-07-28 ENCOUNTER — Ambulatory Visit: Payer: Medicaid Other | Admitting: Women's Health

## 2022-05-21 ENCOUNTER — Encounter: Payer: Self-pay | Admitting: Adult Health

## 2022-05-21 ENCOUNTER — Ambulatory Visit: Payer: Medicaid Other | Admitting: Adult Health

## 2022-05-21 VITALS — BP 120/61 | HR 74 | Ht 64.0 in | Wt 133.0 lb

## 2022-05-21 DIAGNOSIS — Z803 Family history of malignant neoplasm of breast: Secondary | ICD-10-CM | POA: Diagnosis not present

## 2022-05-21 DIAGNOSIS — B369 Superficial mycosis, unspecified: Secondary | ICD-10-CM | POA: Diagnosis not present

## 2022-05-21 DIAGNOSIS — L299 Pruritus, unspecified: Secondary | ICD-10-CM | POA: Diagnosis not present

## 2022-05-21 DIAGNOSIS — N644 Mastodynia: Secondary | ICD-10-CM | POA: Insufficient documentation

## 2022-05-21 MED ORDER — NYSTATIN 100000 UNIT/ML MT SUSP: 5.0000 mL | Freq: Four times a day (QID) | OROMUCOSAL | 1 refills | Status: AC

## 2022-05-21 NOTE — Progress Notes (Signed)
  Subjective:     Patient ID: Maria Henderson, female   DOB: Jan 24, 1996, 27 y.o.   MRN: [id number]  HPI Maria Henderson is a 27 year old black female, with SO, G3P3003, in complaining of pain in left breast last week and left nipple itching. She is breast feeding her 1 yo still. Her mom died or breast cancer at 28, and she was worried.  Last pap was negative HPV,NILM 03/19/21.   Review of Systems Left breast pain last week, none now Left nipple itches She is breastfeeding Reviewed past medical,surgical, social and family history. Reviewed medications and allergies.     Objective:   Physical Exam BP 120/61 (BP Location: Left Arm, Patient Position: Sitting, Cuff Size: Normal)   Pulse 74   Ht  (1.626 m)   Wt 133 lb (60.3 kg)   Breastfeeding Yes   BMI 22.83 kg/m    Skin warm and dry,  Breasts:no dominate palpable mass, retraction or nipple discharge, does have milk both breasts, left nipple a little scaly in areola area. No pain today. Fall risk is moderate  [facility] - 05/21/22 1211       Pregnancy Intention Screening   Does the patient want to become pregnant in the next year? Unsure    Does the patient's partner want to become pregnant in the next year? Unsure    Would the patient like to discuss contraceptive options today? No      Contraception Wrap Up   Current Method Withdrawal or Other Method    End Method Withdrawal or Other Method                Assessment:     1. Itching Left nipple itches  2. Breast pain, left Has pain UIQ last week, none now   3. Family history of breast cancer Mat Gam and mat aunts with breast cancer Will refer for genetic counseling and testing  - Ambulatory referral to Genetics  4. Family history of breast cancer in mother Mom died at 28 Will get mammogram at 39  Will refer to genetics  - Ambulatory referral to Genetics  5. Superficial fungus infection of skin Left nipple itches and scaly in areola Will rx nystatin Meds ordered  this encounter  Medications   nystatin (MYCOSTATIN) 100000 UNIT/ML suspension    Sig: Take 5 mLs (500,000 Units total) by mouth 4 (four) times daily.    Dispense:  60 mL    Refill:  1    Order Specific Question:   Supervising Provider    Answer:   Duane Lope H [[id number]]       Keep clean and dry Will recheck in 4 weeks  Plan:     Recheck breasts  in 4 weeks or sooner if needed

## 2022-06-18 ENCOUNTER — Encounter: Payer: Self-pay | Admitting: Adult Health

## 2022-06-18 ENCOUNTER — Ambulatory Visit: Payer: Medicaid Other | Admitting: Adult Health

## 2022-06-18 VITALS — BP 102/70 | HR 76 | Ht 64.0 in | Wt 128.0 lb

## 2022-06-18 DIAGNOSIS — Z803 Family history of malignant neoplasm of breast: Secondary | ICD-10-CM | POA: Diagnosis not present

## 2022-06-18 DIAGNOSIS — Z3009 Encounter for other general counseling and advice on contraception: Secondary | ICD-10-CM | POA: Diagnosis not present

## 2022-06-18 DIAGNOSIS — N644 Mastodynia: Secondary | ICD-10-CM | POA: Diagnosis not present

## 2022-06-18 DIAGNOSIS — L299 Pruritus, unspecified: Secondary | ICD-10-CM | POA: Diagnosis not present

## 2022-06-18 NOTE — Progress Notes (Signed)
  Subjective:     Patient ID: Maria Henderson, female   DOB: [birth year], 27 y.o.   MRN: [id number]  HPI Maria Henderson is a 27 year old black female with SO, G3P3003 in for breast recheck, no pain and itching has stopped, she has appointment with genetic counselor 07/12/22.      Component Value Date/Time   DIAGPAP  03/19/2021 1541    - Negative for intraepithelial lesion or malignancy (NILM)   DIAGPAP  12/24/2015 0000    NEGATIVE FOR INTRAEPITHELIAL LESIONS OR MALIGNANCY.   HPVHIGH Negative 03/19/2021 1541   ADEQPAP Satisfactory for evaluation.  The absence of an 03/19/2021 1541   ADEQPAP  03/19/2021 1541    endocervical/transformation zone component is not uncommon in pregnant   ADEQPAP patients. 03/19/2021 1541   Review of Systems No breast pain now No itching now Reviewed past medical,surgical, social and family history. Reviewed medications and allergies.        Objective:   Physical Exam   BP 102/70 (BP Location: Left Arm, Patient Position: Sitting, Cuff Size: Normal)   Pulse 76   Ht 5\' 4"  (1.626 m)   Wt 128 lb (58.1 kg)   Breastfeeding Yes   BMI 21.97 kg/m   Skin warm and dry,  Breasts:no dominate palpable mass, retraction or nipple discharge  She is beginning to wean 27 year old from the breast, still wants to nurse at night.   Upstream - 06/18/22 1429       Pregnancy Intention Screening   Does the patient want to become pregnant in the next year? No    Does the patient's partner want to become pregnant in the next year? No    Would the patient like to discuss contraceptive options today? Yes      Contraception Wrap Up   Current Method Abstinence    End Method Abstinence   to get paragard IUD   Contraception Counseling Provided Yes             Assessment:     1. Family history of breast cancer in mother To see genetic counselor 07/12/22  2. Breast pain, left Has resolved   3. Itching Has resolved   4. General counseling and advice for contraceptive  management She wants ParaGard IUD, she is aware it will not stop period      Plan:     No sex for 2 weeks, last sex was 5/20 Return 06/30/22 at 11:50 am for IUD insertion

## 2022-06-30 ENCOUNTER — Ambulatory Visit: Payer: Medicaid Other | Admitting: Adult Health

## 2022-07-13 ENCOUNTER — Inpatient Hospital Stay: Payer: Medicaid Other | Attending: Licensed Clinical Social Worker | Admitting: Licensed Clinical Social Worker

## 2022-07-13 ENCOUNTER — Inpatient Hospital Stay: Payer: Medicaid Other

## 2023-07-16 ENCOUNTER — Ambulatory Visit

## 2023-07-16 VITALS — BP 103/66 | HR 74 | Ht 64.0 in | Wt 125.0 lb

## 2023-07-16 DIAGNOSIS — Z3201 Encounter for pregnancy test, result positive: Secondary | ICD-10-CM | POA: Diagnosis not present

## 2023-07-16 LAB — POCT URINE PREGNANCY: Preg Test, Ur: POSITIVE — AB

## 2023-07-16 NOTE — Progress Notes (Signed)
   NURSE VISIT- PREGNANCY CONFIRMATION   SUBJECTIVE:  Maria Henderson is a 28 y.o. [phone] female at [redacted]w[redacted]d by certain LMP of Patient's last menstrual period was 06/04/2023. Here for pregnancy confirmation.  Home pregnancy test: positive x 1  She reports nausea.  She is not taking prenatal vitamins.    OBJECTIVE:  BP 103/66 (BP Location: Right Arm, Patient Position: Sitting, Cuff Size: Normal)   Pulse 74   Ht 5' 4 (1.626 m)   Wt 125 lb (56.7 kg)   LMP 06/04/2023   Breastfeeding Yes   BMI 21.46 kg/m   Appears well, in no apparent distress  Results for orders placed or performed in visit on 07/16/23 (from the past 24 hours)  POCT urine pregnancy   Collection Time: 07/16/23  8:38 AM  Result Value Ref Range   Preg Test, Ur Positive (A) Negative    ASSESSMENT: Positive pregnancy test, [redacted]w[redacted]d by LMP    PLAN: Schedule for dating ultrasound in 1 week Prenatal vitamins: plans to begin OTC ASAP   Nausea medicines: not currently needed   OB packet given: Yes  Laverne Potter  07/16/2023 8:38 AM

## 2023-07-21 ENCOUNTER — Other Ambulatory Visit

## 2023-07-22 ENCOUNTER — Other Ambulatory Visit

## 2023-07-27 ENCOUNTER — Other Ambulatory Visit: Payer: Self-pay

## 2023-07-27 DIAGNOSIS — O3680X0 Pregnancy with inconclusive fetal viability, not applicable or unspecified: Secondary | ICD-10-CM

## 2023-07-28 ENCOUNTER — Other Ambulatory Visit
